# Patient Record
Sex: Female | Born: 1975
Health system: Southern US, Community
[De-identification: ages and names within clinical notes are randomized; demographics above are authoritative.]

## PROBLEM LIST (undated history)

## (undated) DIAGNOSIS — R87629 Unspecified abnormal cytological findings in specimens from vagina: Secondary | ICD-10-CM

## (undated) DIAGNOSIS — R87619 Unspecified abnormal cytological findings in specimens from cervix uteri: Secondary | ICD-10-CM

## (undated) DIAGNOSIS — Z8619 Personal history of other infectious and parasitic diseases: Secondary | ICD-10-CM

## (undated) DIAGNOSIS — IMO0002 Reserved for concepts with insufficient information to code with codable children: Secondary | ICD-10-CM

## (undated) DIAGNOSIS — A749 Chlamydial infection, unspecified: Secondary | ICD-10-CM

## (undated) HISTORY — DX: Unspecified abnormal cytological findings in specimens from cervix uteri: R87.619

## (undated) HISTORY — PX: WISDOM TOOTH EXTRACTION: SHX21

## (undated) HISTORY — DX: Reserved for concepts with insufficient information to code with codable children: IMO0002

## (undated) HISTORY — DX: Personal history of other infectious and parasitic diseases: Z86.19

## (undated) HISTORY — PX: OTHER SURGICAL HISTORY: SHX169

## (undated) HISTORY — DX: Chlamydial infection, unspecified: A74.9

## (undated) HISTORY — DX: Unspecified abnormal cytological findings in specimens from vagina: R87.629

## (undated) HISTORY — PX: COLONOSCOPY: SHX174

---

## 2011-01-11 ENCOUNTER — Other Ambulatory Visit (HOSPITAL_COMMUNITY): Payer: Self-pay | Admitting: Family Medicine

## 2011-01-11 DIAGNOSIS — N63 Unspecified lump in unspecified breast: Secondary | ICD-10-CM

## 2011-01-11 DIAGNOSIS — Z139 Encounter for screening, unspecified: Secondary | ICD-10-CM

## 2011-01-15 ENCOUNTER — Encounter (HOSPITAL_COMMUNITY): Payer: Self-pay

## 2011-01-30 ENCOUNTER — Ambulatory Visit (HOSPITAL_COMMUNITY)
Admission: RE | Admit: 2011-01-30 | Discharge: 2011-01-30 | Disposition: A | Discharge status: Home / Self Care | Payer: Self-pay | Source: Ambulatory Visit | Attending: Family Medicine | Admitting: Family Medicine

## 2011-01-30 DIAGNOSIS — N62 Hypertrophy of breast: Secondary | ICD-10-CM | POA: Insufficient documentation

## 2011-01-30 DIAGNOSIS — N63 Unspecified lump in unspecified breast: Secondary | ICD-10-CM

## 2011-02-06 ENCOUNTER — Telehealth (INDEPENDENT_AMBULATORY_CARE_PROVIDER_SITE_OTHER): Payer: Self-pay | Admitting: Internal Medicine

## 2011-02-06 NOTE — Telephone Encounter (Signed)
This is the wrong patient

## 2011-11-07 ENCOUNTER — Encounter (HOSPITAL_COMMUNITY): Payer: Self-pay | Admitting: *Deleted

## 2011-11-07 ENCOUNTER — Emergency Department (HOSPITAL_COMMUNITY)
Admission: EM | Admit: 2011-11-07 | Discharge: 2011-11-07 | Disposition: A | Discharge status: Home / Self Care | Payer: Self-pay | Attending: Emergency Medicine | Admitting: Emergency Medicine

## 2011-11-07 DIAGNOSIS — K0889 Other specified disorders of teeth and supporting structures: Secondary | ICD-10-CM

## 2011-11-07 DIAGNOSIS — R6884 Jaw pain: Secondary | ICD-10-CM | POA: Insufficient documentation

## 2011-11-07 MED ORDER — AMOXICILLIN 500 MG PO CAPS: ORAL_CAPSULE | ORAL | Status: DC

## 2011-11-07 MED ORDER — HYDROCODONE-ACETAMINOPHEN 5-325 MG PO TABS: 1.0000 | ORAL_TABLET | ORAL | Status: AC | PRN

## 2011-11-07 NOTE — Discharge Instructions (Signed)
Please use ibuprofen 600 mg (3 tablets) after each meal. Amoxicillin 2 times daily with food. Norco every 4 hours if needed for pain. Please see a dentist as soon as possible.Dental Pain A tooth ache may be caused by cavities (tooth decay). Cavities expose the nerve of the tooth to air and hot or cold temperatures. It may come from an infection or abscess (also called a boil or furuncle) around your tooth. It is also often caused by dental caries (tooth decay). This causes the pain you are having. DIAGNOSIS  Your caregiver can diagnose this problem by exam. TREATMENT   If caused by an infection, it may be treated with medications which kill germs (antibiotics) and pain medications as prescribed by your caregiver. Take medications as directed.   Only take over-the-counter or prescription medicines for pain, discomfort, or fever as directed by your caregiver.   Whether the tooth ache today is caused by infection or dental disease, you should see your dentist as soon as possible for further care.  SEEK MEDICAL CARE IF: The exam and treatment you received today has been provided on an emergency basis only. This is not a substitute for complete medical or dental care. If your problem worsens or new problems (symptoms) appear, and you are unable to meet with your dentist, call or return to this location. SEEK IMMEDIATE MEDICAL CARE IF:   You have a fever.   You develop redness and swelling of your face, jaw, or neck.   You are unable to open your mouth.   You have severe pain uncontrolled by pain medicine.  MAKE SURE YOU:   Understand these instructions.   Will watch your condition.   Will get help right away if you are not doing well or get worse.  Document Released: 05/27/2005 Document Revised: 05/16/2011 Document Reviewed: 01/13/2008 [facility] Patient Information 2012 [place], [place].

## 2011-11-07 NOTE — ED Provider Notes (Signed)
History     CSN: [id number]  Arrival date & time 11/07/11  1522   First MD Initiated Contact with Patient 11/07/11 1530      Chief Complaint  Patient presents with  . Jaw Pain    (Consider location/radiation/quality/duration/timing/severity/associated sxs/prior treatment) Patient is a 36 y.o. female presenting with tooth pain. The history is provided by the patient.  Dental PainThe primary symptoms include mouth pain. Primary symptoms do not include fever, shortness of breath or cough. The symptoms began more than 1 week ago. The symptoms are worsening. The symptoms occur frequently.  Additional symptoms include: dental sensitivity to temperature, gum swelling and jaw pain. Additional symptoms do not include: trouble swallowing and nosebleeds. Medical issues include: periodontal disease. Medical issues do not include: smoking and immunosuppression.    History reviewed. No pertinent past medical history.  Past Surgical History  Procedure Date  . Wisdom tooth extraction     History reviewed. No pertinent family history.  History  Substance Use Topics  . Smoking status: Never Smoker   . Smokeless tobacco: Not on file  . Alcohol Use: Yes    OB History    Grav Para Term Preterm Abortions TAB SAB Ect Mult Living                  Review of Systems  Constitutional: Negative for fever and activity change.       All ROS Neg except as noted in HPI  HENT: Negative for nosebleeds, trouble swallowing and neck pain.   Eyes: Negative for photophobia and discharge.  Respiratory: Negative for cough, shortness of breath and wheezing.   Cardiovascular: Negative for chest pain and palpitations.  Gastrointestinal: Negative for abdominal pain and blood in stool.  Genitourinary: Negative for dysuria, frequency and hematuria.  Musculoskeletal: Negative for back pain and arthralgias.  Skin: Negative.   Neurological: Negative for dizziness, seizures and speech difficulty.    Psychiatric/Behavioral: Negative for hallucinations and confusion.    Allergies  Review of patient's allergies indicates no known allergies.  Home Medications  No current outpatient prescriptions on file.  BP 138/85  Pulse 93  Temp(Src) 98.7 F (37.1 C) (Oral)  Resp 20  Ht 5\' 6"  (1.676 m)  Wt 205 lb (92.987 kg)  BMI 33.09 kg/m2  SpO2 100%  Physical Exam  Nursing note and vitals reviewed. Constitutional: She is oriented to person, place, and time. She appears well-developed and well-nourished.  Non-toxic appearance.  HENT:  Head: Normocephalic.  Right Ear: Tympanic membrane and external ear normal.  Left Ear: Tympanic membrane and external ear normal.       There is swelling of the gum around the first and second molar of the upper jaw. There is no visible abscess present. The airway is patent.  Eyes: EOM and lids are normal. Pupils are equal, round, and reactive to light.  Neck: Normal range of motion. Neck supple. Carotid bruit is not present.  Cardiovascular: Normal rate, regular rhythm, normal heart sounds, intact distal pulses and normal pulses.   Pulmonary/Chest: Breath sounds normal. No respiratory distress.  Abdominal: Soft. Bowel sounds are normal. There is no tenderness. There is no guarding.  Musculoskeletal: Normal range of motion.  Lymphadenopathy:       Head (right side): No submandibular adenopathy present.       Head (left side): No submandibular adenopathy present.    She has no cervical adenopathy.  Neurological: She is alert and oriented to person, place, and time. She has normal strength.  No cranial nerve deficit or sensory deficit.  Skin: Skin is warm and dry.  Psychiatric: She has a normal mood and affect. Her speech is normal.    ED Course  Procedures (including critical care time)  Labs Reviewed - No data to display No results found.   No diagnosis found.    MDM  I have reviewed nursing notes, vital signs, and all appropriate lab and  imaging results for this patient. Patient has swelling of the gum of the first and second molar of the upper jaw. Patient states she does not have insurance at this time to see a dentist. Dental resources were given to the patient. Prescription for Amoxil 500 mg and Norco 5 mg given to the patient patient also advised to use ibuprofen 3 times daily with a meal.       Kathie Dike, PA 11/07/11 1601

## 2011-11-07 NOTE — ED Notes (Signed)
Pt states that she had a filling fall out awhile back and chipped tooth on left lower jaw awhile ago, but pain is on right lower jaw now, states that she has not been able to see dentist due to no insurance, possible cavity to right lower molar

## 2011-11-07 NOTE — ED Provider Notes (Signed)
Medical screening examination/treatment/procedure(s) were performed by non-physician practitioner and as supervising physician I was immediately available for consultation/collaboration.   Rushton Early, MD 11/07/11 2035 

## 2011-11-07 NOTE — ED Notes (Signed)
Pt c/o pain and swelling to her right jaw x 2 weeks. Pt c/o toothache on lower and upper teeth.

## 2012-10-13 ENCOUNTER — Encounter: Payer: Self-pay | Admitting: *Deleted

## 2012-10-14 ENCOUNTER — Encounter: Payer: Self-pay | Admitting: Obstetrics & Gynecology

## 2012-10-14 ENCOUNTER — Ambulatory Visit (INDEPENDENT_AMBULATORY_CARE_PROVIDER_SITE_OTHER): Payer: Medicaid Other | Admitting: Obstetrics & Gynecology

## 2012-10-14 VITALS — BP 110/80 | Wt 209.0 lb

## 2012-10-14 DIAGNOSIS — Z3009 Encounter for other general counseling and advice on contraception: Secondary | ICD-10-CM

## 2012-10-14 DIAGNOSIS — Z302 Encounter for sterilization: Secondary | ICD-10-CM

## 2012-10-15 NOTE — Progress Notes (Signed)
Patient ID: Tammy Scott, female   DOB: 1975-11-23, 37 y.o.   MRN: [id number] Patient in for consult for BTL Tammy Scott is 37 years old, been on Depo for most of those years. Wants permanent sterilization  Discussed options at length  Opts for BTL Papers signed  Face to face time 20 minutes See H&P

## 2012-11-12 ENCOUNTER — Inpatient Hospital Stay (HOSPITAL_COMMUNITY): Admission: RE | Admit: 2012-11-12 | Payer: Self-pay | Source: Ambulatory Visit

## 2012-11-18 ENCOUNTER — Ambulatory Visit (HOSPITAL_COMMUNITY)
Admission: RE | Admit: 2012-11-18 | Payer: Medicaid Other | Source: Ambulatory Visit | Admitting: Obstetrics & Gynecology

## 2012-11-18 ENCOUNTER — Encounter (HOSPITAL_COMMUNITY): Admission: RE | Payer: Self-pay | Source: Ambulatory Visit

## 2012-11-18 SURGERY — LIGATION, FALLOPIAN TUBE, LAPAROSCOPIC
Anesthesia: General | Laterality: Bilateral

## 2012-12-03 ENCOUNTER — Encounter: Payer: Medicaid Other | Admitting: Obstetrics & Gynecology

## 2013-04-22 ENCOUNTER — Encounter (INDEPENDENT_AMBULATORY_CARE_PROVIDER_SITE_OTHER): Payer: Self-pay

## 2013-04-22 ENCOUNTER — Ambulatory Visit (INDEPENDENT_AMBULATORY_CARE_PROVIDER_SITE_OTHER): Payer: Medicaid Other | Admitting: Gastroenterology

## 2013-04-22 ENCOUNTER — Other Ambulatory Visit: Payer: Self-pay | Admitting: Gastroenterology

## 2013-04-22 ENCOUNTER — Encounter: Payer: Self-pay | Admitting: Gastroenterology

## 2013-04-22 VITALS — BP 128/77 | HR 85 | Temp 98.5°F | Wt 213.2 lb

## 2013-04-22 DIAGNOSIS — R399 Unspecified symptoms and signs involving the genitourinary system: Secondary | ICD-10-CM | POA: Insufficient documentation

## 2013-04-22 DIAGNOSIS — K921 Melena: Secondary | ICD-10-CM

## 2013-04-22 DIAGNOSIS — R3989 Other symptoms and signs involving the genitourinary system: Secondary | ICD-10-CM

## 2013-04-22 MED ORDER — PEG 3350-KCL-NA BICARB-NACL 420 G PO SOLR: 4000.0000 mL | ORAL | Status: DC

## 2013-04-22 MED ORDER — HYDROCORTISONE 2.5 % RE CREA: 1.0000 "application " | TOPICAL_CREAM | Freq: Two times a day (BID) | RECTAL | Status: DC

## 2013-04-22 NOTE — Patient Instructions (Signed)
We have scheduled you for a colonoscopy with Dr. Darrick Penna in the near future.  For rectal itching: use the Anusol cream twice a day for 7 days.   Add a probiotic daily. This can be found over the counter. Some examples are Digestive Advantage, Phillip's Colon Health, walgreen's brand, Align, Restora.  Please complete the urinalysis. We will be in touch with the results! Sometimes it takes a few days to hear the final word.

## 2013-04-22 NOTE — Assessment & Plan Note (Signed)
Low-volume hematochezia without other concerning symptoms. Question benign source such as internal hemorrhoids. Start Anusol cream and proceed with colonoscopy with Dr. Darrick Penna in the near future. The risks, benefits, and alternatives have been discussed in detail with the patient. They state understanding and desire to proceed. May be a good candidate for banding as outpatient. Further recommendations to follow.   Add probiotic as well; samples provided.

## 2013-04-22 NOTE — Assessment & Plan Note (Signed)
Check UA with culture reflex. Refer back to PCP if worsening of symptoms.

## 2013-04-22 NOTE — Progress Notes (Signed)
Primary Care Physician:  Tammy Scott, [name] Primary Gastroenterologist:  Dr. Darrick Scott   Chief Complaint  Patient presents with  . Rectal Bleeding    HPI:   Tammy Scott presents today at the request of Tammy Scott, [name], due to low-volume hematochezia. Notes paper hematochezia for about 3-4 weeks. Not a daily occurrence. Occasional rectal itching, no pain. Feels like bowel habits are regular for her. BMs are long, tubular, well-formed. Every once in awhile may have an upset stomach but otherwise ok. Denies significant abdominal pain. Occasional discomfort after bowel movement. Unable to correlate with anything. Denies unexplained weight loss or lack of appetite. Urinating frequently. Sometimes foul odor. Last Thursday, smelled like ammonia. Feels gassy now. Feels full of gas lately.   Past Medical History  Diagnosis Date  . Abnormal pap   . Chlamydia   . History of gonorrhea     Past Surgical History  Procedure Laterality Date  . Wisdom tooth extraction      Current Outpatient Prescriptions  Medication Sig Dispense Refill  . cetirizine (ZYRTEC) 10 MG tablet Take 10 mg by mouth daily.      . cholecalciferol (VITAMIN D) 1000 UNITS tablet Take 1,000 Units by mouth daily.      . medroxyPROGESTERone (DEPO-PROVERA) 150 MG/ML injection Inject 150 mg into the muscle every 3 (three) months.      . Multiple Vitamin (MULTIVITAMIN) tablet Take 1 tablet by mouth daily.       No current facility-administered medications for this visit.    Allergies as of 04/22/2013  . (No Known Allergies)    Family History  Problem Relation Age of Onset  . Cancer Mother     breast  . Colon polyps Mother     History   Social History  . Marital Status: Single    Spouse Name: N/A    Number of Children: N/A  . Years of Education: N/A   Occupational History  . full-time Consulting civil engineer   . [employer]    Social History Main Topics  . Smoking status: Former Games developer  . Smokeless tobacco: Not  on file  . Alcohol Use: Yes     Comment: socially  . Drug Use: No  . Sexual Activity: Yes    Birth Control/ Protection: Injection   Other Topics Concern  . Not on file   Social History Narrative  . No narrative on file    Review of Systems: As mentioned in HPI.   Physical Exam: BP 128/77  Pulse 85  Temp(Src) 98.5 F (36.9 C) (Oral)  Wt 213 lb 3.2 oz (96.707 kg) General:   Alert and oriented. Pleasant and cooperative. Well-nourished and well-developed.  Head:  Normocephalic and atraumatic. Eyes:  Without icterus, sclera clear and conjunctiva pink.  Ears:  Normal auditory acuity. Nose:  No deformity, discharge,  or lesions. Mouth:  No deformity or lesions, oral mucosa pink.  Neck:  Supple, without mass or thyromegaly. Lungs:  Clear to auscultation bilaterally. No wheezes, rales, or rhonchi. No distress.  Heart:  S1, S2 present without murmurs appreciated.  Abdomen:  +BS, soft, non-tender and non-distended. No HSM noted. No guarding or rebound. No masses appreciated.  Rectal:  Deferred  Msk:  Symmetrical without gross deformities. Normal posture. Pulses:  Normal pulses noted. Extremities:  Without clubbing or edema. Neurologic:  Alert and  oriented x4;  grossly normal neurologically. Skin:  Intact without significant lesions or rashes. Cervical Nodes:  No significant cervical adenopathy. Psych:  Alert and cooperative.  Normal mood and affect.  Hgb 13.1 per outside records. Heme negative.

## 2013-04-23 NOTE — Progress Notes (Signed)
Cc PCP 

## 2013-04-28 LAB — URINALYSIS W MICROSCOPIC + REFLEX CULTURE
Bilirubin Urine: NEGATIVE
Glucose, UA: NEGATIVE mg/dL
Specific Gravity, Urine: 1.028 (ref 1.005–1.030)
Urobilinogen, UA: 0.2 mg/dL (ref 0.0–1.0)

## 2013-04-30 ENCOUNTER — Encounter (HOSPITAL_COMMUNITY): Payer: Self-pay | Admitting: *Deleted

## 2013-04-30 ENCOUNTER — Encounter (HOSPITAL_COMMUNITY)
Admission: RE | Disposition: A | Discharge status: Home / Self Care | Payer: Self-pay | Source: Ambulatory Visit | Attending: Gastroenterology

## 2013-04-30 ENCOUNTER — Ambulatory Visit (HOSPITAL_COMMUNITY)
Admission: RE | Admit: 2013-04-30 | Discharge: 2013-04-30 | Disposition: A | Discharge status: Home / Self Care | Payer: BC Managed Care – PPO | Source: Ambulatory Visit | Attending: Gastroenterology | Admitting: Gastroenterology

## 2013-04-30 DIAGNOSIS — K625 Hemorrhage of anus and rectum: Secondary | ICD-10-CM | POA: Insufficient documentation

## 2013-04-30 DIAGNOSIS — K648 Other hemorrhoids: Secondary | ICD-10-CM | POA: Insufficient documentation

## 2013-04-30 DIAGNOSIS — K921 Melena: Secondary | ICD-10-CM

## 2013-04-30 HISTORY — PX: COLONOSCOPY: SHX5424

## 2013-04-30 SURGERY — COLONOSCOPY
Anesthesia: Moderate Sedation

## 2013-04-30 MED ORDER — MEPERIDINE HCL 100 MG/ML IJ SOLN
INTRAMUSCULAR | Status: AC
  Filled 2013-04-30: qty 2

## 2013-04-30 MED ORDER — MIDAZOLAM HCL 5 MG/5ML IJ SOLN
INTRAMUSCULAR | Status: DC | PRN
  Administered 2013-04-30 (×3): 2 mg via INTRAVENOUS

## 2013-04-30 MED ORDER — MEPERIDINE HCL 100 MG/ML IJ SOLN
INTRAMUSCULAR | Status: DC | PRN
  Administered 2013-04-30: 50 mg via INTRAVENOUS
  Administered 2013-04-30 (×2): 25 mg via INTRAVENOUS

## 2013-04-30 MED ORDER — SODIUM CHLORIDE 0.9 % IV SOLN
INTRAVENOUS | Status: DC
  Administered 2013-04-30: 13:00:00 via INTRAVENOUS

## 2013-04-30 MED ORDER — MIDAZOLAM HCL 5 MG/5ML IJ SOLN
INTRAMUSCULAR | Status: AC
  Filled 2013-04-30: qty 10

## 2013-04-30 MED ORDER — STERILE WATER FOR IRRIGATION IR SOLN
Status: DC | PRN
  Administered 2013-04-30: 13:00:00

## 2013-04-30 NOTE — Op Note (Signed)
[facility] [address] [place], [address]   COLONOSCOPY PROCEDURE REPORT  PATIENT: Tammy Scott, Tammy Scott  MR#: [id number] BIRTHDATE: 02/24/1976 , 37  yrs. old GENDER: Female ENDOSCOPIST: Jonette Eva, MD REFERRED [id number] Muse, PA PROCEDURE DATE:  04/30/2013 PROCEDURE:   Colonoscopy, diagnostic INDICATIONS:Rectal Bleeding. MEDICATIONS: Demerol 100 mg IV and Versed 6 mg IV  DESCRIPTION OF PROCEDURE:    Physical exam was performed.  Informed consent was obtained from the patient after explaining the benefits, risks, and alternatives to procedure.  The patient was connected to monitor and placed in left lateral position. Continuous oxygen was provided by nasal cannula and IV medicine administered through an indwelling cannula.  After administration of sedation and rectal exam, the patients rectum was intubated and the EC-[id number] ([id number])  colonoscope was advanced under direct visualization to the ileum.  The scope was removed slowly by carefully examining the color, texture, anatomy, and integrity mucosa on the way out.  The patient was recovered in endoscopy and discharged home in satisfactory condition.    COLON FINDINGS: The mucosa appeared normal in the terminal ileum.  , A normal appearing cecum, ileocecal valve, and appendiceal orifice were identified.  The ascending, hepatic flexure, transverse, splenic flexure, descending, sigmoid colon and rectum appeared unremarkable.  No polyps or cancers were seen.  , and Small internal hemorrhoids were found.  PREP QUALITY: good.  CECAL W/D TIME: 17 minutes     COMPLICATIONS: None  ENDOSCOPIC IMPRESSION: 1.   Normal mucosa in the terminal ileum 2.   Normal colon 3.   Small internal hemorrhoids  RECOMMENDATIONS: HIGH FIBER DIET DRINK WATER COMPLETER ANUSOL TCS IN 10 YEARS       _______________________________ eSignedJonette Eva, MD 04/30/2013 2:47 PM

## 2013-04-30 NOTE — H&P (Signed)
  Primary Care Physician:  Tylene Fantasia., PA-C Primary Gastroenterologist:  Dr. Darrick Penna  Pre-Procedure History & Physical: HPI:  Tammy Scott is a 37 y.o. female here for  BRBPR.  Past Medical History  Diagnosis Date  . Abnormal pap   . Chlamydia   . History of gonorrhea     Past Surgical History  Procedure Laterality Date  . Wisdom tooth extraction    . Colyposcopy      Prior to Admission medications   Medication Sig Start Date End Date Taking? Authorizing Provider  hydrocortisone (ANUSOL-HC) 2.5 % rectal cream Place 1 application rectally 2 (two) times daily. 04/22/13  Yes Nira Retort, NP  polyethylene glycol-electrolytes (TRILYTE) 420 G solution Take 4,000 mLs by mouth as directed. 04/22/13  Yes West Bali, MD  cetirizine (ZYRTEC) 10 MG tablet Take 10 mg by mouth daily.    Historical Provider, MD  cholecalciferol (VITAMIN D) 1000 UNITS tablet Take 1,000 Units by mouth daily.    Historical Provider, MD  medroxyPROGESTERone (DEPO-PROVERA) 150 MG/ML injection Inject 150 mg into the muscle every 3 (three) months.    Historical Provider, MD  Multiple Vitamin (MULTIVITAMIN) tablet Take 1 tablet by mouth daily.    Historical Provider, MD    Allergies as of 04/22/2013  . (No Known Allergies)    Family History  Problem Relation Age of Onset  . Cancer Mother     breast  . Colon polyps Mother     History   Social History  . Marital Status: Single    Spouse Name: N/A    Number of Children: N/A  . Years of Education: N/A   Occupational History  . full-time Consulting civil engineer   . [employer]    Social History Main Topics  . Smoking status: Former Games developer  . Smokeless tobacco: Not on file  . Alcohol Use: Yes     Comment: socially  . Drug Use: No  . Sexual Activity: Yes    Birth Control/ Protection: Injection   Other Topics Concern  . Not on file   Social History Narrative  . No narrative on file    Review of Systems: See HPI, otherwise negative  ROS   Physical Exam: BP 128/73  Pulse 69  Temp(Src) 98.3 F (36.8 C) (Oral)  Resp 18  Ht 5\' 6"  (1.676 m)  Wt 213 lb (96.616 kg)  BMI 34.40 kg/m2  SpO2 100% General:   Alert,  pleasant and cooperative in NAD Head:  Normocephalic and atraumatic. Neck:  Supple; Lungs:  Clear throughout to auscultation.    Heart:  Regular rate and rhythm. Abdomen:  Soft, nontender and nondistended. Normal bowel sounds, without guarding, and without rebound.   Neurologic:  Alert and  oriented x4;  grossly normal neurologically.  Impression/Plan:    BRBPR  PLAN: TCS TODAY

## 2013-04-30 NOTE — Progress Notes (Signed)
REVIEWED.  

## 2013-05-03 NOTE — Progress Notes (Signed)
Quick Note:  LMOM for a return call. Routing to Soledad Gerlach to fax copy to [facility]. ______

## 2013-05-05 ENCOUNTER — Encounter (HOSPITAL_COMMUNITY): Payer: Self-pay | Admitting: Gastroenterology

## 2013-05-05 NOTE — Progress Notes (Signed)
Quick Note:  Called pt and informed. She said she is not having any urinary symptoms. ______

## 2013-07-06 ENCOUNTER — Emergency Department (HOSPITAL_COMMUNITY)
Admission: EM | Admit: 2013-07-06 | Discharge: 2013-07-06 | Disposition: A | Discharge status: Home / Self Care | Payer: BC Managed Care – PPO | Attending: Emergency Medicine | Admitting: Emergency Medicine

## 2013-07-06 ENCOUNTER — Encounter (HOSPITAL_COMMUNITY): Payer: Self-pay | Admitting: Emergency Medicine

## 2013-07-06 ENCOUNTER — Emergency Department (HOSPITAL_COMMUNITY): Payer: BC Managed Care – PPO

## 2013-07-06 DIAGNOSIS — Y939 Activity, unspecified: Secondary | ICD-10-CM | POA: Insufficient documentation

## 2013-07-06 DIAGNOSIS — S90121A Contusion of right lesser toe(s) without damage to nail, initial encounter: Secondary | ICD-10-CM

## 2013-07-06 DIAGNOSIS — Z8619 Personal history of other infectious and parasitic diseases: Secondary | ICD-10-CM | POA: Insufficient documentation

## 2013-07-06 DIAGNOSIS — IMO0002 Reserved for concepts with insufficient information to code with codable children: Secondary | ICD-10-CM | POA: Insufficient documentation

## 2013-07-06 DIAGNOSIS — Z87891 Personal history of nicotine dependence: Secondary | ICD-10-CM | POA: Insufficient documentation

## 2013-07-06 DIAGNOSIS — Z791 Long term (current) use of non-steroidal anti-inflammatories (NSAID): Secondary | ICD-10-CM | POA: Insufficient documentation

## 2013-07-06 DIAGNOSIS — S90129A Contusion of unspecified lesser toe(s) without damage to nail, initial encounter: Secondary | ICD-10-CM | POA: Insufficient documentation

## 2013-07-06 DIAGNOSIS — Z79899 Other long term (current) drug therapy: Secondary | ICD-10-CM | POA: Insufficient documentation

## 2013-07-06 DIAGNOSIS — Z23 Encounter for immunization: Secondary | ICD-10-CM | POA: Insufficient documentation

## 2013-07-06 DIAGNOSIS — Y929 Unspecified place or not applicable: Secondary | ICD-10-CM | POA: Insufficient documentation

## 2013-07-06 MED ORDER — NAPROXEN 250 MG PO TABS: 250.0000 mg | ORAL_TABLET | Freq: Two times a day (BID) | ORAL | Status: DC

## 2013-07-06 NOTE — ED Provider Notes (Signed)
CSN: [id number]     Arrival date & time 07/06/13  4098 History   First MD Initiated Contact with Patient 07/06/13 [phone]     Chief Complaint  Patient presents with  . Toe Pain    HPI Pt was seen at 0850. Per pt, c/o gradual onset and persistence of constant right 5th toe "pain" that began last night. Pt states the pain began after she hit her toe against a table. Denies open wounds, no foot/ankle pain, no other injury, no focal motor weakness, no tingling/numbness in extremity.    Past Medical History  Diagnosis Date  . Abnormal pap   . Chlamydia   . History of gonorrhea    Past Surgical History  Procedure Laterality Date  . Wisdom tooth extraction    . Colyposcopy    . Colonoscopy N/A 04/30/2013    Procedure: COLONOSCOPY;  Surgeon: West Bali, MD;  Location: [facility];  Service: Endoscopy;  Laterality: N/A;  1:15   Family History  Problem Relation Age of Onset  . Cancer Mother     breast  . Colon polyps Mother    History  Substance Use Topics  . Smoking status: Former Games developer  . Smokeless tobacco: Not on file  . Alcohol Use: Yes     Comment: socially   OB History   Grav Para Term Preterm Abortions TAB SAB Ect Mult Living   3 2   1  1         Review of Systems ROS: Statement: All systems negative except as marked or noted in the HPI; Constitutional: Negative for fever and chills. ; ; Eyes: Negative for eye pain, redness and discharge. ; ; ENMT: Negative for ear pain, hoarseness, nasal congestion, sinus pressure and sore throat. ; ; Cardiovascular: Negative for chest pain, palpitations, diaphoresis, dyspnea and peripheral edema. ; ; Respiratory: Negative for cough, wheezing and stridor. ; ; Gastrointestinal: Negative for nausea, vomiting, diarrhea, abdominal pain, blood in stool, hematemesis, jaundice and rectal bleeding. . ; ; Genitourinary: Negative for dysuria, flank pain and hematuria. ; ; Musculoskeletal: +right 5th toe pain. Negative for back pain and neck pain.  Negative for swelling and deformity.; ; Skin: Negative for pruritus, rash, abrasions, blisters, bruising and skin lesion.; ; Neuro: Negative for headache, lightheadedness and neck stiffness. Negative for weakness, altered level of consciousness , altered mental status, extremity weakness, paresthesias, involuntary movement, seizure and syncope.       Allergies  Review of patient's allergies indicates no known allergies.  Home Medications   Current Outpatient Rx  Name  Route  Sig  Dispense  Refill  . cetirizine (ZYRTEC) 10 MG tablet   Oral   Take 10 mg by mouth daily.         . cholecalciferol (VITAMIN D) 1000 UNITS tablet   Oral   Take 1,000 Units by mouth daily.         . hydrocortisone (ANUSOL-HC) 2.5 % rectal cream   Rectal   Place 1 application rectally 2 (two) times daily.   30 g   1   . medroxyPROGESTERone (DEPO-PROVERA) 150 MG/ML injection   Intramuscular   Inject 150 mg into the muscle every 3 (three) months.         . Multiple Vitamin (MULTIVITAMIN) tablet   Oral   Take 1 tablet by mouth daily.         . naproxen (NAPROSYN) 250 MG tablet   Oral   Take 1 tablet (250 mg total) by  mouth 2 (two) times daily with a meal.   14 tablet   0    BP 150/80  Pulse 74  Temp(Src) 98.1 F (36.7 C) (Oral)  Resp 16  Ht 5\' 6"  (1.676 m)  Wt 210 lb (95.255 kg)  BMI 33.91 kg/m2  SpO2 100% Physical Exam 0855: Physical examination:  Nursing notes reviewed; Vital signs and O2 SAT reviewed;  Constitutional: Well developed, Well nourished, Well hydrated, In no acute distress; Head:  Normocephalic, atraumatic; Eyes: EOMI, PERRL, No scleral icterus; ENMT: Mouth and pharynx normal, Mucous membranes moist; Neck: Supple, Full range of motion; Cardiovascular: Regular rate and rhythm; Respiratory: Breath sounds clear, No wheezes.  Speaking full sentences with ease, Normal respiratory effort/excursion; Chest: No deformity, Movement normal; Abdomen: Nondistended; Extremities: Pulses  normal, +mild right 5th toe generalized tenderness to palp without deformity, edema, erythema, ecchymosis, or open wounds. No right foot/ankle/knee tenderness. No calf edema or asymmetry.; Neuro: AA&Ox3, Major CN grossly intact.  Speech clear. No gross focal motor or sensory deficits in extremities. Climbs on and off stretcher easily by herself. Gait steady.; Skin: Color normal, Warm, Dry.   ED Course  Procedures    EKG Interpretation   None       MDM  MDM Reviewed: previous chart, nursing note and vitals Interpretation: x-ray     Dg Foot Complete Right 07/06/2013   CLINICAL DATA:  Blow to the right fifth metatarsal.  Pain.  EXAM: RIGHT FOOT COMPLETE - 3+ VIEW  COMPARISON:  None.  FINDINGS: There is no evidence of fracture or dislocation. There is no evidence of arthropathy or other focal bone abnormality. Soft tissues are unremarkable.  IMPRESSION: Negative exam.   Electronically Signed   By: Drusilla Kannerhomas  Dalessio M.D.   On: 07/06/2013 09:18    0925:  No acute findings on XR; tx symptomatically at this time. Dx and testing d/w pt.  Questions answered.  Verb understanding, agreeable to d/c home with outpt f/u.   Laray AngerKathleen M Loa Idler, DO 07/07/13 1744

## 2013-07-06 NOTE — ED Notes (Signed)
Pt reports right little toe pain starting last night. Pt. Reports hitting toe on table yesterday.

## 2013-07-06 NOTE — Discharge Instructions (Signed)
°Emergency Department Resource Guide °1) Find a Doctor and Pay Out of Pocket °Although you won't have to find out who is covered by your insurance plan, it is a good idea to ask around and get recommendations. You will then need to call the office and see if the doctor you have chosen will accept you as a new patient and what types of options they offer for patients who are self-pay. Some doctors offer discounts or will set up payment plans for their patients who do not have insurance, but you will need to ask so you aren't surprised when you get to your appointment. ° °2) Contact Your Local Health Department °Not all health departments have doctors that can see patients for sick visits, but many do, so it is worth a call to see if yours does. If you don't know where your local health department is, you can check in your phone book. The CDC also has a tool to help you locate your state's health department, and many state websites also have listings of all of their local health departments. ° °3) Find a Walk-in Clinic °If your illness is not likely to be very severe or complicated, you may want to try a walk in clinic. These are popping up all over the country in pharmacies, drugstores, and shopping centers. They're usually staffed by nurse practitioners or physician assistants that have been trained to treat common illnesses and complaints. They're usually fairly quick and inexpensive. However, if you have serious medical issues or chronic medical problems, these are probably not your best option. ° °No Primary Care Doctor: °- Call [facility] at  832-8000 - they can help you locate a primary care doctor that  accepts your insurance, provides certain services, etc. °- Physician Referral Service- 1-800-533-3463 ° °Chronic Pain Problems: °Organization         Address  Phone   Notes  °[facility]  (336) 297-2271 Patients need to be referred by their primary care doctor.  ° °Medication  Assistance: °Organization         Address  Phone   Notes  °[place] [facility] [address] °[place], [place] [address] (336) 641-8030 --Must be a resident of [place] °-- Must have NO insurance coverage whatsoever (no Medicaid/ Medicare, etc.) °-- The pt. MUST have a primary care doctor that directs their care regularly and follows them in the community °  °[facility]  (866) 331-1348   °[employer]  (888) 892-1162   ° °Agencies that provide inexpensive medical care: °Organization         Address  Phone   Notes  °[facility]  (336) 832-8035   °[facility]    (336) 832-7272   °[facility] [address] °[place], [place] [address] (336) 832-4777   °[facility] [address], °[place] (336) 271-4999   °[facility]    (336) 373-0678   °[facility]    (336) 272-1050   °[facility] ° [address], [place] Phone:  (336) 832-4444, Fax:  (336) 832-4440 Hours of Operation:  9 am - 6 pm, M-F.  Also accepts Medicaid/Medicare and self-pay.  °[facility] ° [address], [place] Phone: (336) 832-3150, Fax: (336) [phone]. Hours of Operation:  8:30 am - 5:30 pm, M-F.  Also accepts Medicaid and self-pay.  °[facility] [place] [address], [place] Phone: (336) 878-6027   °[facility] [address], [place], [place] (336)723-1848, Ext. 123 Mondays & Thursdays: 7-9 AM.  First 15 patients are seen on a first come, first serve basis. °  ° °Medicaid-accepting [place] Providers: ° °Organization         Address  Phone   Notes  °[facility] [address], [place] (336) 641-2100 Also accepts self-pay patients.  °[facility] [address], [place] ° (336) 856-9996   °[facility] [address], [place]  (336) 288-8857   °[facility] [address] [place] [address], [place] (336) 299-7000   °Veita Bland [address], [place]  ° (336) 373-1557 Only accepts [place] Access Medicaid patients after they have their name applied to their card.  ° °Self-Pay (no insurance) in [place]: ° °Organization         Address  Phone   Notes  °Sickle Cell Patients, [facility] [address], [place] (336) 832-1970   °[facility] [address], [place] (336) 832-4400   °[facility] ° [address], [place] (336) 992-4800   °[facility]/Dr. Osei-Bonsu ° [address] [place] [address], [place] or [address], [place] (336) 841-8500 Phone number for both [place] and [place] locations is the same.  °[facility] [address], [place] (336) 299-0000   °[facility] [address] [place] [address], [place] or [address] (336) 852-7530 °(336) 878-2260   °[facility] [address], [place] (336) 350-1642, phone; (336) 294-5005, fax Sees patients 1st and 3rd Saturday of every month.  Must not qualify for public or private insurance (i.e. Medicaid, Medicare, [place] Health Choice, Veterans' Benefits) • Household income should be no more than 200% of the poverty level •The clinic cannot treat you if you are pregnant or think you are pregnant • Sexually transmitted diseases are not treated at the clinic.  ° ° °Dental Care: °Organization         Address  Phone  Notes  °[place] [facility] [address], [place] (336) 641-6152 Accepts children up to age 21 who are enrolled in Medicaid or [place] Health Choice; pregnant women with a Medicaid card; and children who have applied for Medicaid or [place] Health Choice, but were declined, whose parents can pay a reduced fee at time of service.  °[place]  [facility] [place]  [address], [place] (336) 641-7733 Accepts children up to age 21 who are enrolled in Medicaid or [place] Health Choice; pregnant women with a Medicaid card; and children who have applied for Medicaid or [place] Health Choice, but were declined, whose parents can pay a reduced fee at time of service.  °[facility] ° [address], [place] (336) 641-4533 Patients are seen by appointment only. Walk-ins are not accepted. [facility] will see patients 18 years of age and older. °Monday - Tuesday (8am-5pm) °Most Wednesdays (8:30-5pm) °$30 per visit, cash only  °[facility] ° [address], [place] (336) 641-4533 Patients are seen by appointment only. Walk-ins are not accepted. [facility] will see patients 18 years of age and older. °One   Wednesday Evening (Monthly: Volunteer Based).  $30 per visit, cash only  °[facility]  (919) 537-3737 for adults; Children under age 4, call Graduate Pediatric Dentistry at (919) [phone]. Children aged 4-14, please call (919) [phone] to request a pediatric application. ° Dental services are provided in all areas of dental care including fillings, crowns and bridges, complete and partial dentures, implants, gum treatment, root canals, and extractions. Preventive care is also provided. Treatment is provided to both adults and children. °Patients are selected via a lottery and there is often a waiting list. °  °[facility] [address], °[place] ° (336) 763-8833 [email or web address] °  °[facility] [address], [place], [place] (336)723-1848, Ext. 123 Second and Fourth Thursday of each month, opens at 6:30 AM; Clinic ends at 9 AM.  Patients are seen on a first-come first-served basis, and a limited number are seen during each clinic.  ° °[facility] ° [address], [place], [place] (336) 723-7904    Eligibility Requirements °You must have lived in [place], [place], or [place] counties for at least the last three months. °  You cannot be eligible for state or federal sponsored healthcare insurance, including Veterans Administration, Medicaid, or Medicare. °  You generally cannot be eligible for healthcare insurance through your employer.  °  How to apply: °Eligibility screenings are held every Tuesday and Wednesday afternoon from 1:00 pm until 4:00 pm. You do not need an appointment for the interview!  °[facility] [address], [place], [place] 336-631-2330   °[facility]  336-342-8273   °[facility]  336-703-3100   °[facility]  336-570-6415   ° °Behavioral Health Resources in the Community: °Intensive Outpatient Programs °Organization         Address  Phone  Notes  °[facility] [address], [place], [place] 336-878-6098   °[facility] [address], [place], [place] 336-832-9800   °[facility] [address], [place], [place] ° 336-882-2125   °[facility] [address],  °[place], [place] 1-800-853-5163 or 336-641-4981   °Substance Abuse Resources °Organization         Address  Phone  Notes  °[facility]  336-882-2125   °[facility]  336-784-9470   °[facility]  336-285-9073   °[facility]  336-845-3988   °Residential & Outpatient Substance Abuse Program  1-800-659-3381   °Psychological Services °Organization         Address  Phone  Notes  °[facility]  336- 832-9600   °[facility]  336- 378-7881   °[facility] [address], [place] 1-800-853-5163 or 336-641-4981   ° °Mobile Crisis Teams °Organization         Address  Phone  Notes  °[facility], Mobile Crisis Care Unit  1-877-626-1772   °Assertive °[facility] ° [address]  [place], [place] 336-834-9664   °Sharon DeEsch [address] °[place] 336-554-5454   ° °Self-Help/Support Groups °Organization         Address  Phone             Notes  °[facility] of  - variety of support groups  336- 373-1402 Call for more information  °Narcotics Anonymous (NA), [facility] [address], °[place]  2 meetings at this location  ° °  Residential Treatment Programs Organization         Address  Phone  Notes  [facility] [address],    [place]  [phone]   [facility]  [address], [place] [phone], [place], [place] [phone]   [facility] [address], [place] [phone]) [phone] Admissions: 8am-3pm M-F  [facility] [address]    [place], [place] [phone]   [facility] [address], [place], [place] [phone]   [facility] [address]  [place], [place] [phone]) [phone]   [facility] Intensive Outpatient [address], [place], [place] [phone]   [facility] [address]  [place], [place] [phone] or [phone]   [facility] [address] [place], [place] [phone] Accepts Medicaid  [facility] [address]  [place] [phone] Substance Abuse/Addiction Treatment   [facility]         Address  Phone  Notes  [facility]  226-795-0269(888) [phone]   Angie FavaJulie Brannon, PhD [address] [place], [place]   [phone](336) [phone] or [phone](336) [phone]   [facility]   [address] [place], [place] [phone](336) [phone]   [facility] [address], [place], [place] ([phone])[phone](336) [phone] Insurance/Medicaid/sponsorship through [facility] [address]                                    [place], [place] [phone](336) [phone] Therapy/tele-psych/case    [facility] [address]   [place], [place] [phone](336) [phone]    Dr. Lolly MustacheArfeen  [phone](336) [phone]   [facility]  [employer] [facility] 1) [address], [place] 2) [address], [place] 3)  [address], [place] [phone](336) [phone](336) [phone]  [phone](336) [phone]   [facility] Child Abuse Hotline 3518000087(336) (825)495-3556 or 705-442-0125(336) (606) [phone] (After Hours)       Take the prescription as directed. Also can take over the counter tylenol, as directed on packaging, as needed for discomfort. Apply moist heat or ice to the area(s) of discomfort, for 15 minutes at a time, several times per day for the next few days.  Do not fall asleep on a heating or ice pack.  Call your regular medical doctor today to schedule a follow up appointment this week.  Return to the Emergency Department immediately if worsening.

## 2014-04-11 ENCOUNTER — Encounter (HOSPITAL_COMMUNITY): Payer: Self-pay | Admitting: Emergency Medicine

## 2014-04-21 ENCOUNTER — Telehealth: Payer: Self-pay | Admitting: Family Medicine

## 2014-04-27 NOTE — Telephone Encounter (Signed)
error 

## 2014-06-12 ENCOUNTER — Emergency Department (HOSPITAL_COMMUNITY)
Admission: EM | Admit: 2014-06-12 | Discharge: 2014-06-12 | Disposition: A | Discharge status: Home / Self Care | Payer: BC Managed Care – PPO | Attending: Emergency Medicine | Admitting: Emergency Medicine

## 2014-06-12 ENCOUNTER — Encounter (HOSPITAL_COMMUNITY): Payer: Self-pay | Admitting: Emergency Medicine

## 2014-06-12 DIAGNOSIS — Z79899 Other long term (current) drug therapy: Secondary | ICD-10-CM | POA: Diagnosis not present

## 2014-06-12 DIAGNOSIS — Z87891 Personal history of nicotine dependence: Secondary | ICD-10-CM | POA: Diagnosis not present

## 2014-06-12 DIAGNOSIS — Z7952 Long term (current) use of systemic steroids: Secondary | ICD-10-CM | POA: Diagnosis not present

## 2014-06-12 DIAGNOSIS — H6502 Acute serous otitis media, left ear: Secondary | ICD-10-CM

## 2014-06-12 DIAGNOSIS — Z8619 Personal history of other infectious and parasitic diseases: Secondary | ICD-10-CM | POA: Insufficient documentation

## 2014-06-12 DIAGNOSIS — Z791 Long term (current) use of non-steroidal anti-inflammatories (NSAID): Secondary | ICD-10-CM | POA: Diagnosis not present

## 2014-06-12 DIAGNOSIS — R05 Cough: Secondary | ICD-10-CM | POA: Diagnosis present

## 2014-06-12 DIAGNOSIS — J069 Acute upper respiratory infection, unspecified: Secondary | ICD-10-CM | POA: Insufficient documentation

## 2014-06-12 MED ORDER — MAGIC MOUTHWASH W/LIDOCAINE: 5.0000 mL | Freq: Three times a day (TID) | ORAL | Status: DC | PRN

## 2014-06-12 MED ORDER — IBUPROFEN 800 MG PO TABS
800.0000 mg | ORAL_TABLET | Freq: Once | ORAL | Status: AC
  Administered 2014-06-12: 800 mg via ORAL
  Filled 2014-06-12: qty 1

## 2014-06-12 MED ORDER — PREDNISONE 10 MG PO TABS: ORAL_TABLET | ORAL | Status: DC

## 2014-06-12 MED ORDER — GUAIFENESIN-CODEINE 100-10 MG/5ML PO SOLN
10.0000 mL | Freq: Once | ORAL | Status: AC
  Administered 2014-06-12: 10 mL via ORAL
  Filled 2014-06-12: qty 10

## 2014-06-12 NOTE — Discharge Instructions (Signed)
Upper Respiratory Infection, Adult An upper respiratory infection (URI) is also known as the common cold. It is often caused by a type of germ (virus). Colds are easily spread (contagious). You can pass it to others by kissing, coughing, sneezing, or drinking out of the same glass. Usually, you get better in 1 or 2 weeks.  HOME CARE   Only take medicine as told by your doctor.  Use a warm mist humidifier or breathe in steam from a hot shower.  Drink enough water and fluids to keep your pee (urine) clear or pale yellow.  Get plenty of rest.  Return to work when your temperature is back to normal or as told by your doctor. You may use a face mask and wash your hands to stop your cold from spreading. GET HELP RIGHT AWAY IF:   After the first few days, you feel you are getting worse.  You have questions about your medicine.  You have chills, shortness of breath, or brown or red spit (mucus).  You have yellow or brown snot (nasal discharge) or pain in the face, especially when you bend forward.  You have a fever, puffy (swollen) neck, pain when you swallow, or white spots in the back of your throat.  You have a bad headache, ear pain, sinus pain, or chest pain.  You have a high-pitched whistling sound when you breathe in and out (wheezing).  You have a lasting cough or cough up blood.  You have sore muscles or a stiff neck. MAKE SURE YOU:   Understand these instructions.  Will watch your condition.  Will get help right away if you are not doing well or get worse. Document Released: 11/13/2007 Document Revised: 08/19/2011 Document Reviewed: 09/01/2013 ExitCare Patient Information 2015 [employer]. This information is not intended to replace advice given to you by your health care provider. Make sure you discuss any questions you have with your health care provider.  

## 2014-06-12 NOTE — ED Provider Notes (Signed)
CSN: [id number]     Arrival date & time 06/12/14  1719 History  This chart was scribed for non-physician practitioner, Pauline Aus, PA-C working with Flint Melter, MD found by Gwenyth Ober, ED scribe. This patient was seen in room APFT20/APFT20 and the patient's care was started at [facility] PM   Chief Complaint  Patient presents with  . Cough   The history is provided by the patient. No language interpreter was used.    HPI Comments: Tammy Scott is a 39 y.o. female who presents to the Emergency Department complaining of constant, gradually worsening cough and sore throat that started 2 weeks ago. She states mild left ear pain, intermittent hoarse voice and sinus congestion that started today as associated symptoms. Pt reports that symptoms started as a dry, hacking cough that has progressed  to sore throat and congestion. She also states white sputum.  Pt denies fever, chills, CP and SOB as associated symptoms.  She has not tried any OTC medications for symtpom relief.  Past Medical History  Diagnosis Date  . Abnormal pap   . Chlamydia   . History of gonorrhea    Past Surgical History  Procedure Laterality Date  . Wisdom tooth extraction    . Colyposcopy    . Colonoscopy N/A 04/30/2013    Procedure: COLONOSCOPY;  Surgeon: West Bali, MD;  Location: [facility];  Service: Endoscopy;  Laterality: N/A;  1:15   Family History  Problem Relation Age of Onset  . Cancer Mother     breast  . Colon polyps Mother    History  Substance Use Topics  . Smoking status: Former Games developer  . Smokeless tobacco: Not on file  . Alcohol Use: Yes     Comment: socially   OB History    Gravida Para Term Preterm AB TAB SAB Ectopic Multiple Living   Review of Systems  Constitutional: Negative for fever, chills, activity change and appetite change.  HENT: Positive for congestion, rhinorrhea, sinus pressure, sore throat and voice change. Negative for facial swelling and  trouble swallowing.   Eyes: Negative for visual disturbance.  Respiratory: Positive for cough. Negative for chest tightness, shortness of breath, wheezing and stridor.   Cardiovascular: Negative for chest pain.  Gastrointestinal: Negative for nausea and vomiting.  Genitourinary: Negative for dysuria, hematuria and flank pain.  Musculoskeletal: Negative for neck pain and neck stiffness.  Skin: Negative.  Negative for rash.  Neurological: Negative for dizziness, weakness, numbness and headaches.  Hematological: Negative for adenopathy.  Psychiatric/Behavioral: Negative for confusion.  All other systems reviewed and are negative.  Allergies  Review of patient's allergies indicates no known allergies.  Home Medications   Prior to Admission medications   Medication Sig Start Date End Date Taking? Authorizing Provider  cetirizine (ZYRTEC) 10 MG tablet Take 10 mg by mouth daily.    Historical Provider, MD  cholecalciferol (VITAMIN D) 1000 UNITS tablet Take 1,000 Units by mouth daily.    Historical Provider, MD  hydrocortisone (ANUSOL-HC) 2.5 % rectal cream Place 1 application rectally 2 (two) times daily. 04/22/13   Nira Retort, NP  medroxyPROGESTERone (DEPO-PROVERA) 150 MG/ML injection Inject 150 mg into the muscle every 3 (three) months.    Historical Provider, MD  Multiple Vitamin (MULTIVITAMIN) tablet Take 1 tablet by mouth daily.    Historical Provider, MD  naproxen (NAPROSYN) 250 MG tablet Take 1 tablet (250 mg total)  by mouth 2 (two) times daily with a meal. 07/06/13   Samuel Jester, DO   BP 129/81 mmHg  Pulse 89  Temp(Src) 99.4 F (37.4 C) (Oral)  Resp 18  Ht  (1.676 m)  Wt 210 lb (95.255 kg)  BMI 33.91 kg/m2  SpO2 100% Physical Exam  Constitutional: She is oriented to person, place, and time. She appears well-developed and well-nourished. No distress.  HENT:  Head: Normocephalic and atraumatic.  Right Ear: Tympanic membrane and ear canal normal.  Left Ear: No mastoid  tenderness. Tympanic membrane is not erythematous and not bulging. No hemotympanum.  Nose: Mucosal edema and rhinorrhea present.  Mouth/Throat: Uvula is midline and mucous membranes are normal. No oral lesions. No trismus in the jaw. No uvula swelling. Posterior oropharyngeal erythema present. No oropharyngeal exudate, posterior oropharyngeal edema or tonsillar abscesses.  Mild air fluid levels of left TM, no erythema; oropharynx is mildy erythematous, no edema, no tonsillar exudate.  No PTA  Eyes: Conjunctivae are normal. Pupils are equal, round, and reactive to light.  Neck: Normal range of motion and phonation normal. Neck supple. No Brudzinski's sign and no Kernig's sign noted.  Cardiovascular: Normal rate, regular rhythm, normal heart sounds and intact distal pulses.   No murmur heard. Pulmonary/Chest: Effort normal and breath sounds normal. No respiratory distress. She has no wheezes. She has no rales.  Abdominal: Soft. She exhibits no distension. There is no tenderness. There is no rebound and no guarding.  Musculoskeletal: Normal range of motion. She exhibits no edema.  Lymphadenopathy:    She has no cervical adenopathy.  Neurological: She is alert and oriented to person, place, and time. No cranial nerve deficit. She exhibits normal muscle tone. Coordination normal.  Skin: Skin is warm and dry. No rash noted.  Psychiatric: She has a normal mood and affect. Her behavior is normal.  Nursing note and vitals reviewed.   ED Course  Procedures (including critical care time) DIAGNOSTIC STUDIES: Oxygen Saturation is 100% on RA, normal by my interpretation.    COORDINATION OF CARE: 6:20 PM Discussed treatment plan with pt at bedside and pt agreed to plan.   Labs Review Labs Reviewed - No data to display  Imaging Review No results found.   EKG Interpretation None      MDM   Final diagnoses:  URI (upper respiratory infection)  Acute serous otitis media of left ear, recurrence  not specified    Pt well appearing, VSS.  Likely viral process.  She agrees to symtpomatic treatment   I personally performed the services described in this documentation, which was scribed in my presence. The recorded information has been reviewed and is accurate.   Tobe Kervin L. Trisha Mangle, PA-C 06/13/14 0046  Flint Melter, MD 06/15/14 [phone]

## 2014-06-12 NOTE — ED Notes (Signed)
Pt reports cough and sore throat x2 weeks. nad noted.

## 2014-12-18 ENCOUNTER — Emergency Department (HOSPITAL_COMMUNITY)
Admission: EM | Admit: 2014-12-18 | Discharge: 2014-12-18 | Disposition: A | Discharge status: Home / Self Care | Payer: BLUE CROSS/BLUE SHIELD | Attending: Emergency Medicine | Admitting: Emergency Medicine

## 2014-12-18 ENCOUNTER — Encounter (HOSPITAL_COMMUNITY): Payer: Self-pay | Admitting: Emergency Medicine

## 2014-12-18 DIAGNOSIS — Z7952 Long term (current) use of systemic steroids: Secondary | ICD-10-CM | POA: Diagnosis not present

## 2014-12-18 DIAGNOSIS — Z79899 Other long term (current) drug therapy: Secondary | ICD-10-CM | POA: Diagnosis not present

## 2014-12-18 DIAGNOSIS — Y9389 Activity, other specified: Secondary | ICD-10-CM | POA: Diagnosis not present

## 2014-12-18 DIAGNOSIS — Z791 Long term (current) use of non-steroidal anti-inflammatories (NSAID): Secondary | ICD-10-CM | POA: Diagnosis not present

## 2014-12-18 DIAGNOSIS — X58XXXA Exposure to other specified factors, initial encounter: Secondary | ICD-10-CM | POA: Insufficient documentation

## 2014-12-18 DIAGNOSIS — S46912A Strain of unspecified muscle, fascia and tendon at shoulder and upper arm level, left arm, initial encounter: Secondary | ICD-10-CM | POA: Insufficient documentation

## 2014-12-18 DIAGNOSIS — Z8619 Personal history of other infectious and parasitic diseases: Secondary | ICD-10-CM | POA: Diagnosis not present

## 2014-12-18 DIAGNOSIS — S4992XA Unspecified injury of left shoulder and upper arm, initial encounter: Secondary | ICD-10-CM | POA: Diagnosis present

## 2014-12-18 DIAGNOSIS — Z87891 Personal history of nicotine dependence: Secondary | ICD-10-CM | POA: Insufficient documentation

## 2014-12-18 DIAGNOSIS — Y9289 Other specified places as the place of occurrence of the external cause: Secondary | ICD-10-CM | POA: Diagnosis not present

## 2014-12-18 DIAGNOSIS — Y990 Civilian activity done for income or pay: Secondary | ICD-10-CM | POA: Diagnosis not present

## 2014-12-18 DIAGNOSIS — S199XXA Unspecified injury of neck, initial encounter: Secondary | ICD-10-CM | POA: Insufficient documentation

## 2014-12-18 MED ORDER — NAPROXEN 500 MG PO TABS: 500.0000 mg | ORAL_TABLET | Freq: Two times a day (BID) | ORAL | Status: DC

## 2014-12-18 MED ORDER — METHOCARBAMOL 500 MG PO TABS
ORAL_TABLET | ORAL | Status: AC
  Filled 2014-12-18: qty 1

## 2014-12-18 MED ORDER — METHOCARBAMOL 500 MG PO TABS: 500.0000 mg | ORAL_TABLET | Freq: Three times a day (TID) | ORAL | Status: DC

## 2014-12-18 MED ORDER — METHOCARBAMOL 500 MG PO TABS
500.0000 mg | ORAL_TABLET | Freq: Once | ORAL | Status: AC
  Administered 2014-12-18: 500 mg via ORAL

## 2014-12-18 NOTE — ED Provider Notes (Signed)
CSN: [id number]     Arrival date & time 12/18/14  1749 History   This chart was scribed for Tammy Ausammy Nuel Dejaynes, PA-C working with Dr. Viviann SpareSteven Rancour by Elveria Risingimelie Horne, ED Scribe. This patient was seen in room APFT21/APFT21 and the patient's care was started at 6:37 PM.   Chief Complaint  Patient presents with  . Shoulder Pain   The history is provided by the patient. No language interpreter was used.   HPI Comments: Tammy Scott is a 39 y.o. female who presents to the Emergency Department complaining of burning left shoulder and neck upon wakening this morning. Patient reports lifting boxes last night at her job, but is unable to recall specific injury. Patient reports tightness and a "gripping sensation" that is exacerbated with certain movements of her left arm. Patient denies radiation of pain into her left arm. Patient reports treatment with heat, ice and 800mg  ibuprofen today, without complete relief of her pain. Patient shares that she has an upcoming appointment with her PCP in four days. She denies fever, redness, swelling, numbness or weakness to the left arm, chest pain and dizziness.  Past Medical History  Diagnosis Date  . Abnormal pap   . Chlamydia   . History of gonorrhea    Past Surgical History  Procedure Laterality Date  . Wisdom tooth extraction    . Colyposcopy    . Colonoscopy N/A 04/30/2013    Procedure: COLONOSCOPY;  Surgeon: West BaliSandi L Fields, MD;  Location: [facility];  Service: Endoscopy;  Laterality: N/A;  1:15   Family History  Problem Relation Age of Onset  . Cancer Mother     breast  . Colon polyps Mother    History  Substance Use Topics  . Smoking status: Former Smoker -- 1.00 packs/day for 3 years    Types: Cigarettes    Quit date: 06/10/2005  . Smokeless tobacco: Never Used  . Alcohol Use: Yes     Comment: socially   OB History    Gravida Para Term Preterm AB TAB SAB Ectopic Multiple Living   3 2 2  1  1   2      Review of Systems   Constitutional: Negative for fever and chills.  Respiratory: Negative for shortness of breath.   Cardiovascular: Negative for chest pain.  Genitourinary: Negative for dysuria and difficulty urinating.  Musculoskeletal: Positive for arthralgias (left shoulder pain) and neck pain. Negative for joint swelling and neck stiffness.  Skin: Negative for color change and wound.  Neurological: Negative for dizziness, weakness, numbness and headaches.  All other systems reviewed and are negative.  Allergies  Review of patient's allergies indicates no known allergies.  Home Medications   Prior to Admission medications   Medication Sig Start Date End Date Taking? Authorizing Provider  Alum & Mag Hydroxide-Simeth (MAGIC MOUTHWASH W/LIDOCAINE) SOLN Take 5 mLs by mouth 3 (three) times daily as needed for mouth pain. Swish and spit, do not swallow 06/12/14   Inocencio Roy, PA-C  cetirizine (ZYRTEC) 10 MG tablet Take 10 mg by mouth daily.    Historical Provider, MD  cholecalciferol (VITAMIN D) 1000 UNITS tablet Take 1,000 Units by mouth daily.    Historical Provider, MD  hydrocortisone (ANUSOL-HC) 2.5 % rectal cream Place 1 application rectally 2 (two) times daily. 04/22/13   Nira RetortAnna W Sams, NP  medroxyPROGESTERone (DEPO-PROVERA) 150 MG/ML injection Inject 150 mg into the muscle every 3 (three) months.    Historical Provider, MD  Multiple Vitamin (MULTIVITAMIN) tablet Take 1  tablet by mouth daily.    Historical Provider, MD  naproxen (NAPROSYN) 250 MG tablet Take 1 tablet (250 mg total) by mouth 2 (two) times daily with a meal. 07/06/13   Samuel Jester, DO  predniSONE (DELTASONE) 10 MG tablet Take 6 tablets day one, 5 tablets day two, 4 tablets day three, 3 tablets day four, 2 tablets day five, then 1 tablet day six 06/12/14   Mailin Coglianese, PA-C   Triage Vitals: BP 114/69 mmHg  Pulse 62  Temp(Src) 98.9 F (37.2 C) (Oral)  Resp 19  Ht  (1.676 m)  Wt 230 lb (104.327 kg)  BMI 37.14 kg/m2  SpO2  95% Physical Exam  Constitutional: She is oriented to person, place, and time. She appears well-developed and well-nourished. No distress.  HENT:  Head: Normocephalic and atraumatic.  Eyes: EOM are normal.  Neck: Phonation normal. Neck supple. Muscular tenderness present. No tracheal deviation present. No Kernig's sign noted.    Cardiovascular: Normal rate, regular rhythm and intact distal pulses.   Pulmonary/Chest: Effort normal. No respiratory distress.  Musculoskeletal: Normal range of motion.       Left shoulder: She exhibits tenderness. She exhibits no bony tenderness, normal pulse and normal strength.  Localized tenderness to the left paracervical and trapezius muscles. Patient with mild tenderness to left scapular border. Pain reproduced with abduction. Grip strength is strong and symmetrical.   Neurological: She is alert and oriented to person, place, and time.  Pulse and sensation intact.   Skin: Skin is warm and dry.  Psychiatric: She has a normal mood and affect. Her behavior is normal.  Nursing note and vitals reviewed.   ED Course  Procedures (including critical care time)  COORDINATION OF CARE: 6:41 PM- Discussed treatment plan with patient at bedside and patient agreed to plan.   Labs Review Labs Reviewed - No data to display  Imaging Review No results found.   EKG Interpretation None      MDM   Final diagnoses:  Shoulder strain, left, initial encounter   Pain to left paracervical muscles.  Likely strain secondary to lifting.  No concerning neurological symptoms or concern for septic joint.  Pt agrees to symptomatic tx and close PMD or orthopedic f/u if needed.     I personally performed the services described in this documentation, which was scribed in my presence. The recorded information has been reviewed and is accurate.    Tammy Aus, PA-C 12/20/14 1233  Glynn Octave, MD 12/20/14 [phone]

## 2014-12-18 NOTE — Discharge Instructions (Signed)

## 2014-12-18 NOTE — ED Notes (Signed)
Patient c/o left shoulder pain that she woke with. Patient woke with stiffness and pain in neck to left shoulder. Patient states feels like left shoulder is burning. Reports using heating pack, making pain worse, took  ibuprofen with no relief. Denies any chest pain. Per patient pain not made worse by palpitation. No rash noted.

## 2015-09-25 ENCOUNTER — Other Ambulatory Visit: Payer: Self-pay

## 2015-09-25 ENCOUNTER — Other Ambulatory Visit (HOSPITAL_COMMUNITY)
Admission: RE | Admit: 2015-09-25 | Discharge: 2015-09-25 | Disposition: A | Discharge status: Home / Self Care | Payer: BLUE CROSS/BLUE SHIELD | Source: Ambulatory Visit | Attending: Family | Admitting: Family

## 2015-09-25 DIAGNOSIS — Z113 Encounter for screening for infections with a predominantly sexual mode of transmission: Secondary | ICD-10-CM | POA: Insufficient documentation

## 2015-09-25 DIAGNOSIS — Z01419 Encounter for gynecological examination (general) (routine) without abnormal findings: Secondary | ICD-10-CM | POA: Diagnosis not present

## 2015-09-25 DIAGNOSIS — N760 Acute vaginitis: Secondary | ICD-10-CM | POA: Insufficient documentation

## 2015-09-26 LAB — CYTOLOGY - PAP

## 2015-11-15 ENCOUNTER — Emergency Department (HOSPITAL_COMMUNITY)
Admission: EM | Admit: 2015-11-15 | Discharge: 2015-11-15 | Disposition: A | Discharge status: Home / Self Care | Payer: BLUE CROSS/BLUE SHIELD | Attending: Emergency Medicine | Admitting: Emergency Medicine

## 2015-11-15 ENCOUNTER — Encounter (HOSPITAL_COMMUNITY): Payer: Self-pay | Admitting: Emergency Medicine

## 2015-11-15 DIAGNOSIS — Z79899 Other long term (current) drug therapy: Secondary | ICD-10-CM | POA: Insufficient documentation

## 2015-11-15 DIAGNOSIS — R51 Headache: Secondary | ICD-10-CM | POA: Insufficient documentation

## 2015-11-15 DIAGNOSIS — R112 Nausea with vomiting, unspecified: Secondary | ICD-10-CM | POA: Diagnosis not present

## 2015-11-15 DIAGNOSIS — H9201 Otalgia, right ear: Secondary | ICD-10-CM | POA: Insufficient documentation

## 2015-11-15 DIAGNOSIS — R519 Headache, unspecified: Secondary | ICD-10-CM

## 2015-11-15 DIAGNOSIS — Z87891 Personal history of nicotine dependence: Secondary | ICD-10-CM | POA: Insufficient documentation

## 2015-11-15 MED ORDER — KETOROLAC TROMETHAMINE 30 MG/ML IJ SOLN
30.0000 mg | Freq: Once | INTRAMUSCULAR | Status: AC
  Administered 2015-11-15: 30 mg via INTRAVENOUS
  Filled 2015-11-15: qty 1

## 2015-11-15 MED ORDER — ONDANSETRON HCL 4 MG/2ML IJ SOLN
4.0000 mg | Freq: Once | INTRAMUSCULAR | Status: AC
  Administered 2015-11-15: 4 mg via INTRAVENOUS
  Filled 2015-11-15: qty 2

## 2015-11-15 MED ORDER — ONDANSETRON HCL 4 MG PO TABS: 4.0000 mg | ORAL_TABLET | Freq: Four times a day (QID) | ORAL | Status: DC

## 2015-11-15 MED ORDER — OXYMETAZOLINE HCL 0.05 % NA SOLN
1.0000 | Freq: Once | NASAL | Status: AC
  Administered 2015-11-15: 1 via NASAL
  Filled 2015-11-15: qty 15

## 2015-11-15 MED ORDER — SODIUM CHLORIDE 0.9 % IV BOLUS (SEPSIS)
1000.0000 mL | Freq: Once | INTRAVENOUS | Status: AC
  Administered 2015-11-15: 1000 mL via INTRAVENOUS

## 2015-11-15 NOTE — ED Provider Notes (Signed)
CSN: [id number]     Arrival date & time 11/15/15  [id number] History   By signing my name below, I, Iona BeardChristian Pulliam, attest that this documentation has been prepared under the direction and in the presence of Jacalyn LefevreJulie Ugochukwu Chichester, MD.   Electronically Signed: Iona Beardhristian Pulliam, ED Scribe. 11/15/2015. 10:12 AM   Chief Complaint  Patient presents with  . Headache    The history is provided by the patient. No language interpreter was used.   HPI Comments: Colin Muldersracie L Roswell is a 40 y.o. female who presents to the Emergency Department complaining of gradual onset, headache, ongoing since yesterday at 3 PM. Pt said the pain radiates down into her right ear and face. Pt reports nausea and emesis, which began after eating last night. Pt also complains of facial pain. No other associated symptoms noted. Pt has taken tylenol with no relief to symptoms. She has also tried an ice mask and allegra with no relief to symptoms. No other worsening or alleviating factors noted. Pt denies weakness, or any other pertinent symptoms.  Past Medical History  Diagnosis Date  . Abnormal pap   . Chlamydia   . History of gonorrhea    Past Surgical History  Procedure Laterality Date  . Wisdom tooth extraction    . Colyposcopy    . Colonoscopy N/A 04/30/2013    Procedure: COLONOSCOPY;  Surgeon: West BaliSandi L Fields, MD;  Location: [facility];  Service: Endoscopy;  Laterality: N/A;  1:15   Family History  Problem Relation Age of Onset  . Cancer Mother     breast  . Colon polyps Mother    Social History  Substance Use Topics  . Smoking status: Former Smoker -- 1.00 packs/day for 3 years    Types: Cigarettes    Quit date: 06/10/2005  . Smokeless tobacco: Never Used  . Alcohol Use: Yes     Comment: socially   OB History    Gravida Para Term Preterm AB TAB SAB Ectopic Multiple Living   3 2 2  1  1   2      Review of Systems  HENT: Positive for ear pain.   Eyes: Positive for photophobia.  Gastrointestinal: Positive  for vomiting.  Neurological: Positive for headaches. Negative for weakness.  All other systems reviewed and are negative.    Allergies  Review of patient's allergies indicates no known allergies.  Home Medications   Prior to Admission medications   Medication Sig Start Date End Date Taking? Authorizing Provider  acetaminophen (TYLENOL) 500 MG tablet Take 1,000 mg by mouth every 6 (six) hours as needed for moderate pain or headache.   Yes Historical Provider, MD  cholecalciferol (VITAMIN D) 1000 UNITS tablet Take 1,000 Units by mouth daily.   Yes Historical Provider, MD  fexofenadine (ALLEGRA) 180 MG tablet Take 320 mg by mouth daily as needed for allergies or rhinitis.   Yes Historical Provider, MD  levonorgestrel (MIRENA) 20 MCG/24HR IUD 1 each by Intrauterine route once.   Yes Historical Provider, MD  medroxyPROGESTERone (DEPO-PROVERA) 150 MG/ML injection Inject 150 mg into the muscle every 3 (three) months.   Yes Historical Provider, MD  Multiple Vitamin (MULTIVITAMIN) tablet Take 1 tablet by mouth daily.   Yes Historical Provider, MD  ondansetron (ZOFRAN) 4 MG tablet Take 1 tablet (4 mg total) by mouth every 6 (six) hours. 11/15/15   Jacalyn LefevreJulie Kline Bulthuis, MD   BP 141/93 mmHg  Pulse 64  Temp(Src) 98.3 F (36.8 C) (Oral)  Resp 18  Ht   (1.626 m)  Wt 210 lb (95.255 kg)  BMI 36.03 kg/m2  SpO2 96% Physical Exam  Constitutional: She is oriented to person, place, and time. She appears well-developed and well-nourished.  HENT:  Head: Normocephalic.  Right Ear: External ear normal.  Left Ear: External ear normal.  Nose: Right sinus exhibits maxillary sinus tenderness. Left sinus exhibits maxillary sinus tenderness.  Maxillary sinus tenderness noted.  Eyes: EOM are normal.  Neck: Normal range of motion.  Cardiovascular: Normal rate, regular rhythm and normal heart sounds.  Exam reveals no gallop.   No murmur heard. Pulmonary/Chest: Effort normal and breath sounds normal. No  respiratory distress. She has no wheezes. She has no rales.  Abdominal: Soft. Bowel sounds are normal. She exhibits no distension. There is no tenderness.  Musculoskeletal: Normal range of motion.  Neurological: She is alert and oriented to person, place, and time.  Psychiatric: She has a normal mood and affect.  Nursing note and vitals reviewed.   ED Course  Procedures (including critical care time) DIAGNOSTIC STUDIES: Oxygen Saturation is 96% on RA, normal by my interpretation.    COORDINATION OF CARE: 9:02 AM Discussed treatment plan with pt at bedside and pt agreed to plan.  Labs Review Labs Reviewed - No data to display  Imaging Review No results found. I have personally reviewed and evaluated these images and lab results as part of my medical decision-making.   EKG Interpretation None      MDM  Pt is feeling much better.  She knows to return if worse and to f/u with her pcp. Final diagnoses:  Acute nonintractable headache, unspecified headache type   I personally performed the services described in this documentation, which was scribed in my presence. The recorded information has been reviewed and is accurate.     Jacalyn Lefevre, MD 11/15/15 1013

## 2015-11-15 NOTE — ED Notes (Addendum)
Pt reports h/a since yesterday, pt reports she thought it was a sinus h/a, but h/a continues into today.  Pt denies unilateral weakness.  Pt has taken allegra with little relief.  Pt has 2 episodes of emesis..Marland Kitchen

## 2015-11-15 NOTE — Discharge Instructions (Signed)

## 2016-04-16 ENCOUNTER — Encounter: Payer: Self-pay | Admitting: Neurology

## 2016-04-16 ENCOUNTER — Ambulatory Visit (INDEPENDENT_AMBULATORY_CARE_PROVIDER_SITE_OTHER): Payer: BLUE CROSS/BLUE SHIELD | Admitting: Neurology

## 2016-04-16 VITALS — BP 122/70 | HR 77 | Ht 66.0 in | Wt 198.9 lb

## 2016-04-16 DIAGNOSIS — E237 Disorder of pituitary gland, unspecified: Secondary | ICD-10-CM | POA: Diagnosis not present

## 2016-04-16 DIAGNOSIS — R930 Abnormal findings on diagnostic imaging of skull and head, not elsewhere classified: Secondary | ICD-10-CM

## 2016-04-16 NOTE — Patient Instructions (Signed)
Often, it is something benign, such as a cyst. To get a better image, we will get MRI of brain with and without contrast with attention to the pituitary gland We will contact you with results and what further testing (if any) would be required.

## 2016-04-16 NOTE — Progress Notes (Signed)
NEUROLOGY CONSULTATION NOTE  Tammy Scott MRN: [id number] DOB: 10/29/1975  Referring provider: Damien FusiHunter Dawson, DMD, MSD Primary care provider: Boneta LucksJennifer Brown, NP  Reason for consult:  Abnormality of pituitary gland.  HISTORY OF PRESENT ILLNESS: Tammy Scott is a 40 year old woman who presents for pituitary mass.  History obtained by patient and dental records.  Prior to receiving dental work, she had a baseline CT of the sinuses.  She had a CT of the sinuses on 01/31/16, which revealed "enlargement of the sella turcica" measuring "10.57 mm x 19.28 mm" and "appears hydrostatic in character.  It extends inferiorly into the sphenoid sinus and demonstrates thinning and expansion of the intact cortical borders."  Findings said to be consistent with anatomical variation of normal or a cystic mass in the area of the pituitary such as a Rathke's cleft cyst, a cystic pituitary adenoma or a craniopharyngioma.  She has worn glasses since 40 years old, but denies new vision changes.  She has some tension headaches but nothing severe.  She has been on birth control for many years and so has not had a regular period in over 6 years.  She denies galactorrhea, extreme changes in blood pressure or extreme change in weight.  PAST MEDICAL HISTORY: Past Medical History:  Diagnosis Date  . Abnormal pap   . Chlamydia   . History of gonorrhea     PAST SURGICAL HISTORY: Past Surgical History:  Procedure Laterality Date  . COLONOSCOPY N/A 04/30/2013   Procedure: COLONOSCOPY;  Surgeon: West BaliSandi L Fields, MD;  Location: [facility];  Service: Endoscopy;  Laterality: N/A;  1:15  . Colyposcopy    . WISDOM TOOTH EXTRACTION      MEDICATIONS: Current Outpatient Prescriptions on File Prior to Visit  Medication Sig Dispense Refill  . acetaminophen (TYLENOL) 500 MG tablet Take 1,000 mg by mouth every 6 (six) hours as needed for moderate pain or headache.    . cholecalciferol (VITAMIN D) 1000 UNITS tablet  Take 1,000 Units by mouth daily.    . fexofenadine (ALLEGRA) 180 MG tablet Take 320 mg by mouth daily as needed for allergies or rhinitis.    Marland Kitchen. levonorgestrel (MIRENA) 20 MCG/24HR IUD 1 each by Intrauterine route once.    . Multiple Vitamin (MULTIVITAMIN) tablet Take 1 tablet by mouth daily.     No current facility-administered medications on file prior to visit.     ALLERGIES: No Known Allergies  FAMILY HISTORY: Family History  Problem Relation Age of Onset  . Cancer Mother     breast  . Colon polyps Mother     SOCIAL HISTORY: Social History   Social History  . Marital status: Single    Spouse name: N/A  . Number of children: N/A  . Years of education: N/A   Occupational History  . full-time Consulting civil engineerstudent   . [employer]    Social History Main Topics  . Smoking status: Former Smoker    Packs/day: 1.00    Years: 3.00    Types: Cigarettes    Quit date: 06/10/2005  . Smokeless tobacco: Never Used  . Alcohol use Yes     Comment: socially  . Drug use: No  . Sexual activity: Yes    Birth control/ protection: Condom   Other Topics Concern  . Not on file   Social History Narrative  . No narrative on file    REVIEW OF SYSTEMS: Constitutional: No fevers, chills, or sweats, no generalized fatigue, change in appetite Eyes:  No visual changes, double vision, eye pain Ear, nose and throat: No hearing loss, ear pain, nasal congestion, sore throat Cardiovascular: No chest pain, palpitations Respiratory:  No shortness of breath at rest or with exertion, wheezes GastrointestinaI: No nausea, vomiting, diarrhea, abdominal pain, fecal incontinence Genitourinary:  No dysuria, urinary retention or frequency Musculoskeletal:  No neck pain, back pain Integumentary: No rash, pruritus, skin lesions Neurological: as above Psychiatric: No depression, insomnia, anxiety Endocrine: No palpitations, fatigue, diaphoresis, mood swings, change in appetite, change in weight, increased  thirst Hematologic/Lymphatic:  No purpura, petechiae. Allergic/Immunologic: no itchy/runny eyes, nasal congestion, recent allergic reactions, rashes  PHYSICAL EXAM: Vitals:   04/16/16 0757  BP: 122/70  Pulse: 77   General: No acute distress.  Patient appears well-groomed.  Head:  Normocephalic/atraumatic Eyes:  fundi examined but not visualized Neck: supple, no paraspinal tenderness, full range of motion Back: No paraspinal tenderness Heart: regular rate and rhythm Lungs: Clear to auscultation bilaterally. Vascular: No carotid bruits. Neurological Exam: Mental status: alert and oriented to person, place, and time, recent and remote memory intact, fund of knowledge intact, attention and concentration intact, speech fluent and not dysarthric, language intact. Cranial nerves: CN I: not tested CN II: pupils equal, round and reactive to light, visual fields intact CN III, IV, VI:  full range of motion, no nystagmus, no ptosis CN V: facial sensation intact CN VII: upper and lower face symmetric CN VIII: hearing intact CN IX, X: gag intact, uvula midline CN XI: sternocleidomastoid and trapezius muscles intact CN XII: tongue midline Bulk & Tone: normal, no fasciculations. Motor:  5/5 throughout  Sensation: temperature and vibration sensation intact. Deep Tendon Reflexes:  2+ throughout, toes downgoing.  Finger to nose testing:  Without dysmetria.  Heel to shin:  Without dysmetria.  Gait:  Normal station and stride.  Able to turn and tandem walk. Romberg negative.  IMPRESSION: Abnormal finding of pituitary on CT, incidental finding.  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  She is asymptomatic, so benign process is likely.  PLAN: We will get MRI of brain with and without contrast with attention to the pituitary Further recommendations pending results.   Thank you for allowing me to take part in the care of this patient.  Shon MilletAdam Tisa Weisel, DO  CC:  Damien FusiHunter Dawson, DMD, MSD  Boneta LucksJennifer  Brown, NP

## 2016-04-16 NOTE — Progress Notes (Signed)
Chart forwarded.  

## 2016-05-04 ENCOUNTER — Ambulatory Visit
Admission: RE | Admit: 2016-05-04 | Discharge: 2016-05-04 | Disposition: A | Discharge status: Home / Self Care | Payer: BLUE CROSS/BLUE SHIELD | Source: Ambulatory Visit | Attending: Neurology | Admitting: Neurology

## 2016-05-04 DIAGNOSIS — E237 Disorder of pituitary gland, unspecified: Secondary | ICD-10-CM

## 2016-05-04 DIAGNOSIS — R930 Abnormal findings on diagnostic imaging of skull and head, not elsewhere classified: Secondary | ICD-10-CM

## 2016-05-04 MED ORDER — GADOBENATE DIMEGLUMINE 529 MG/ML IV SOLN
15.0000 mL | Freq: Once | INTRAVENOUS | Status: AC | PRN
  Administered 2016-05-04: 14 mL via INTRAVENOUS

## 2016-05-06 ENCOUNTER — Telehealth: Payer: Self-pay

## 2016-05-06 NOTE — Telephone Encounter (Signed)
Left message on machine for pt to return call to the office on mobile number

## 2016-05-06 NOTE — Telephone Encounter (Signed)
-----   Message from Drema DallasAdam R Jaffe, DO sent at 05/05/2016 11:25 AM EST ----- Reviewed MRI.  There is no tumor.  It reveals an area of increased spinal fluid, which is usually a normal variant.  It is nothing concerning.  No further workup or follow up needed.

## 2016-05-06 NOTE — Telephone Encounter (Signed)
Pt called back and message was relayed

## 2017-10-20 ENCOUNTER — Emergency Department: Payer: Self-pay

## 2017-10-20 ENCOUNTER — Other Ambulatory Visit: Payer: Self-pay

## 2017-10-20 ENCOUNTER — Encounter: Payer: Self-pay | Admitting: Emergency Medicine

## 2017-10-20 ENCOUNTER — Emergency Department
Admission: EM | Admit: 2017-10-20 | Discharge: 2017-10-20 | Disposition: A | Discharge status: Home / Self Care | Payer: Self-pay | Attending: Emergency Medicine | Admitting: Emergency Medicine

## 2017-10-20 DIAGNOSIS — R0789 Other chest pain: Secondary | ICD-10-CM | POA: Insufficient documentation

## 2017-10-20 DIAGNOSIS — Z79899 Other long term (current) drug therapy: Secondary | ICD-10-CM | POA: Insufficient documentation

## 2017-10-20 DIAGNOSIS — Z87891 Personal history of nicotine dependence: Secondary | ICD-10-CM | POA: Insufficient documentation

## 2017-10-20 LAB — BASIC METABOLIC PANEL
ANION GAP: 6 (ref 5–15)
BUN: 8 mg/dL (ref 6–20)
CO2: 29 mmol/L (ref 22–32)
Calcium: 8.7 mg/dL — ABNORMAL LOW (ref 8.9–10.3)
Chloride: 103 mmol/L (ref 101–111)
Creatinine, Ser: 0.67 mg/dL (ref 0.44–1.00)
GFR calc non Af Amer: >60 mL/min (ref >60)
GLUCOSE: 96 mg/dL (ref 65–99)
Potassium: 3.4 mmol/L — ABNORMAL LOW (ref 3.5–5.1)
Sodium: 138 mmol/L (ref 135–145)

## 2017-10-20 LAB — CBC
HCT: 37 % (ref 35.0–47.0)
HEMOGLOBIN: 12.5 g/dL (ref 12.0–16.0)
MCH: 30.9 pg (ref 26.0–34.0)
MCHC: 33.8 g/dL (ref 32.0–36.0)
MCV: 91.6 fL (ref 80.0–100.0)
PLATELETS: 273 10*3/uL (ref 150–440)
RBC: 4.04 MIL/uL (ref 3.80–5.20)
RDW: 13.5 % (ref 11.5–14.5)
WBC: 3.8 10*3/uL (ref 3.6–11.0)

## 2017-10-20 LAB — TROPONIN I

## 2017-10-20 MED ORDER — KETOROLAC TROMETHAMINE 30 MG/ML IJ SOLN
30.0000 mg | Freq: Once | INTRAMUSCULAR | Status: AC
  Administered 2017-10-20: 30 mg via INTRAMUSCULAR
  Filled 2017-10-20: qty 1

## 2017-10-20 MED ORDER — GI COCKTAIL ~~LOC~~
30.0000 mL | Freq: Once | ORAL | Status: AC
  Administered 2017-10-20: 30 mL via ORAL
  Filled 2017-10-20: qty 30

## 2017-10-20 MED ORDER — NAPROXEN 500 MG PO TABS: 500.0000 mg | ORAL_TABLET | Freq: Two times a day (BID) | ORAL | 2 refills | Status: DC

## 2017-10-20 NOTE — ED Provider Notes (Signed)
[facility] Emergency Department Provider Note   ____________________________________________    I have reviewed the triage vital signs and the nursing notes.   HISTORY  Chief Complaint Chest Pain     HPI Tammy Scott is a 42 y.o. female who presents with complaints of intermittent chest discomfort over the last week.  She states she has had pain in the center of her chest over her sternum when she has moved in particular directions intermittently over the last week, she reports it became more constant yesterday evening.  She reports if she lifts her arms up she has pain in the center of her chest around her sternum, she reports if she twists to the left of the right she has discomfort there as well, she scrubs as an aching pain.  No history of heart disease.  Does not smoke cigarettes.  No fevers or chills or shortness of breath.  No pleurisy no calf pain or swelling.  Not taken anything for this  Past Medical History:  Diagnosis Date  . Abnormal pap   . Chlamydia   . History of gonorrhea     Patient Active Problem List   Diagnosis Date Noted  . Hematochezia 04/22/2013  . Urinary tract infection symptoms 04/22/2013    Past Surgical History:  Procedure Laterality Date  . COLONOSCOPY N/A 04/30/2013   Procedure: COLONOSCOPY;  Surgeon: West Bali, MD;  Location: [facility];  Service: Endoscopy;  Laterality: N/A;  1:15  . Colyposcopy    . WISDOM TOOTH EXTRACTION      Prior to Admission medications   Medication Sig Start Date End Date Taking? Authorizing Provider  acetaminophen (TYLENOL) 500 MG tablet Take 1,000 mg by mouth every 6 (six) hours as needed for moderate pain or headache.    [provider]  cholecalciferol (VITAMIN D) 1000 UNITS tablet Take 1,000 Units by mouth daily.    [provider]  fexofenadine (ALLEGRA) 180 MG tablet Take 320 mg by mouth daily as needed for allergies or rhinitis.    [provider]  levonorgestrel (MIRENA) 20 MCG/24HR IUD 1 each by Intrauterine route once.    [provider]  Multiple Vitamin (MULTIVITAMIN) tablet Take 1 tablet by mouth daily.    [provider]  naproxen (NAPROSYN) 500 MG tablet Take 1 tablet (500 mg total) by mouth 2 (two) times daily with a meal. 10/20/17   Jene Every, MD     Allergies Patient has no known allergies.  Family History  Problem Relation Age of Onset  . Cancer Mother        breast  . Colon polyps Mother     Social History Social History   Tobacco Use  . Smoking status: Former Smoker    Packs/day: 1.00    Years: 3.00    Pack years: 3.00    Types: Cigarettes    Last attempt to quit: 06/10/2005    Years since quitting: 12.3  . Smokeless tobacco: Never Used  Substance Use Topics  . Alcohol use: Yes    Comment: socially  . Drug use: No    Review of Systems  Constitutional: No fever/chills Eyes: No visual changes.  ENT: No sore throat. Cardiovascular: As above Respiratory: Denies shortness of breath. Gastrointestinal: No abdominal pain.  No nausea, no vomiting.   Genitourinary: Negative for dysuria. Musculoskeletal: Negative for back pain. Skin: Negative for rash. Neurological: Negative for headaches or weakness   ____________________________________________   PHYSICAL EXAM:  VITAL SIGNS: ED Triage Vitals [10/20/17 0827]  Enc Vitals Group     BP 139/75     Pulse Rate 63     Resp 20     Temp 98.5 F (36.9 C)     Temp Source Oral     SpO2 100 %     Weight 95.3 kg (210 lb)     Height 1.651 m ( )     Head Circumference      Peak Flow      Pain Score 9     Pain Loc      Pain Edu?      Excl. in GC?     Constitutional: Alert and oriented. No acute distress. Pleasant and interactive Eyes: Conjunctivae are normal.   Nose: No congestion/rhinnorhea. Mouth/Throat: Mucous membranes are moist.   Neck:  Painless ROM Cardiovascular: Normal rate, regular rhythm.  Grossly normal heart sounds.  Good peripheral circulation.  Able to replicate discomfort by pressing on the sternum on the lateral borders bilaterally Respiratory: Normal respiratory effort.  No retractions.  Gastrointestinal: Soft and nontender. No distention.  No CVA tenderness.  Musculoskeletal: No lower extremity tenderness nor edema.  Warm and well perfused Neurologic:  Normal speech and language. No gross focal neurologic deficits are appreciated.  Skin:  Skin is warm, dry and intact. No rash noted. Psychiatric: Mood and affect are normal. Speech and behavior are normal.  ____________________________________________   LABS (all labs ordered are listed, but only abnormal results are displayed)  Labs Reviewed  BASIC METABOLIC PANEL - Abnormal; Notable for the following components:      Result Value   Potassium 3.4 (*)    Calcium 8.7 (*)    All other components within normal limits  CBC  TROPONIN I   ____________________________________________  EKG  ED ECG REPORT I, Jene Every, the attending physician, personally viewed and interpreted this ECG.  Date: 10/20/2017  Rhythm: normal sinus rhythm QRS Axis: normal Intervals: normal ST/T Wave abnormalities: normal Narrative Interpretation: no evidence of acute ischemia  ____________________________________________  RADIOLOGY  Chest x-ray normal ____________________________________________   PROCEDURES  Procedure(s) performed: No  Procedures   Critical Care performed: No ____________________________________________   INITIAL IMPRESSION / ASSESSMENT AND PLAN / ED COURSE  Pertinent labs & imaging results that were available during my care of the patient were reviewed by me and considered in my medical decision making (see chart for details).  Patient well-appearing in no acute distress.  Lab work is reassuring, EKG is normal.  Chest x-ray is benign.  Exam is most consistent with chest wall tenderness, given  easily reproducible tenderness to the sternum.  Will treat with IM Toradol and reevaluate, anticipate discharge with outpatient follow-up   Patient had complete resolution of discomfort after IM Toradol.  Will treat with NSAIDs, outpatient follow-up with PCP.  Return precautions discussed    ____________________________________________   FINAL CLINICAL IMPRESSION(S) / ED DIAGNOSES  Final diagnoses:  Chest wall pain        Note:  This document was prepared using Dragon voice recognition software and may include unintentional dictation errors.    Jene Every, MD 10/20/17 1146

## 2017-10-20 NOTE — ED Triage Notes (Signed)
Pt reports CP intermittently for the past week and now it is constant. Pt reports pain is center of chest. Pt denies SOB.

## 2017-10-27 DIAGNOSIS — R0789 Other chest pain: Secondary | ICD-10-CM | POA: Insufficient documentation

## 2018-09-11 ENCOUNTER — Other Ambulatory Visit: Payer: Self-pay

## 2018-09-11 ENCOUNTER — Emergency Department
Admission: EM | Admit: 2018-09-11 | Discharge: 2018-09-11 | Disposition: A | Discharge status: Home / Self Care | Payer: Medicaid Other | Attending: Emergency Medicine | Admitting: Emergency Medicine

## 2018-09-11 DIAGNOSIS — Z87891 Personal history of nicotine dependence: Secondary | ICD-10-CM | POA: Insufficient documentation

## 2018-09-11 DIAGNOSIS — Y929 Unspecified place or not applicable: Secondary | ICD-10-CM | POA: Insufficient documentation

## 2018-09-11 DIAGNOSIS — Y999 Unspecified external cause status: Secondary | ICD-10-CM | POA: Insufficient documentation

## 2018-09-11 DIAGNOSIS — Z79899 Other long term (current) drug therapy: Secondary | ICD-10-CM | POA: Insufficient documentation

## 2018-09-11 DIAGNOSIS — Y939 Activity, unspecified: Secondary | ICD-10-CM | POA: Insufficient documentation

## 2018-09-11 DIAGNOSIS — S46812A Strain of other muscles, fascia and tendons at shoulder and upper arm level, left arm, initial encounter: Secondary | ICD-10-CM | POA: Insufficient documentation

## 2018-09-11 DIAGNOSIS — T148XXA Other injury of unspecified body region, initial encounter: Secondary | ICD-10-CM

## 2018-09-11 DIAGNOSIS — M25512 Pain in left shoulder: Secondary | ICD-10-CM

## 2018-09-11 DIAGNOSIS — X58XXXA Exposure to other specified factors, initial encounter: Secondary | ICD-10-CM | POA: Insufficient documentation

## 2018-09-11 MED ORDER — KETOROLAC TROMETHAMINE 30 MG/ML IJ SOLN
30.0000 mg | Freq: Once | INTRAMUSCULAR | Status: AC
  Administered 2018-09-11: 11:00:00 30 mg via INTRAMUSCULAR
  Filled 2018-09-11: qty 1

## 2018-09-11 MED ORDER — LIDOCAINE 5 % EX PTCH
1.0000 | MEDICATED_PATCH | CUTANEOUS | Status: DC
  Administered 2018-09-11: 1 via TRANSDERMAL
  Filled 2018-09-11: qty 1

## 2018-09-11 MED ORDER — KETOROLAC TROMETHAMINE 10 MG PO TABS: 10.0000 mg | ORAL_TABLET | Freq: Four times a day (QID) | ORAL | 0 refills | Status: DC | PRN

## 2018-09-11 MED ORDER — LIDOCAINE 5 % EX PTCH: 1.0000 | MEDICATED_PATCH | CUTANEOUS | 0 refills | Status: DC

## 2018-09-11 MED ORDER — CYCLOBENZAPRINE HCL 5 MG PO TABS: ORAL_TABLET | ORAL | 0 refills | Status: DC

## 2018-09-11 NOTE — ED Provider Notes (Signed)
[facility] Emergency Department Provider Note  ____________________________________________  Time seen: Approximately 10:06 AM  I have reviewed the triage vital signs and the nursing notes.   HISTORY  Chief Complaint Shoulder Pain    HPI Tammy Scott is a 43 y.o. female presents emergency department for evaluation of left upper back pain and shoulder pain for 2 weeks.  Patient states that area feels swollen.  Pain is worse when she moves her left arm and moves her neck.  Patient had a trauma back in 2016 and felt similar pain.  She has been home with the coated 19 quarantine and has been doing a lot of housework using that left arm and thinks this is what flared it up. She has taken Aleve, Tylenol and ibuprofen without relief.  Ice seems to help the pain and heat makes it worse.  She does not have primary care.  No recent trauma.  No recent illness.  No fever, shortness of breath, chest pain.   Past Medical History:  Diagnosis Date  . Abnormal pap   . Chlamydia   . History of gonorrhea     Patient Active Problem List   Diagnosis Date Noted  . Hematochezia 04/22/2013  . Urinary tract infection symptoms 04/22/2013    Past Surgical History:  Procedure Laterality Date  . COLONOSCOPY N/A 04/30/2013   Procedure: COLONOSCOPY;  Surgeon: West Bali, MD;  Location: [facility];  Service: Endoscopy;  Laterality: N/A;  1:15  . Colyposcopy    . WISDOM TOOTH EXTRACTION      Prior to Admission medications   Medication Sig Start Date End Date Taking? Authorizing Provider  acetaminophen (TYLENOL) 500 MG tablet Take 1,000 mg by mouth every 6 (six) hours as needed for moderate pain or headache.    [provider]  cholecalciferol (VITAMIN D) 1000 UNITS tablet Take 1,000 Units by mouth daily.    [provider]  cyclobenzaprine (FLEXERIL) 5 MG tablet Take 1-2 tablets 3 times daily as needed 09/11/18   Enid Derry, PA-C  fexofenadine  (ALLEGRA) 180 MG tablet Take 320 mg by mouth daily as needed for allergies or rhinitis.    [provider]  ketorolac (TORADOL) 10 MG tablet Take 1 tablet (10 mg total) by mouth every 6 (six) hours as needed. 09/11/18   Enid Derry, PA-C  levonorgestrel (MIRENA) 20 MCG/24HR IUD 1 each by Intrauterine route once.    [provider]  lidocaine (LIDODERM) 5 % Place 1 patch onto the skin daily. Remove & Discard patch within 12 hours or as directed by MD 09/11/18   Enid Derry, PA-C  Multiple Vitamin (MULTIVITAMIN) tablet Take 1 tablet by mouth daily.    [provider]    Allergies Patient has no known allergies.  Family History  Problem Relation Age of Onset  . Cancer Mother        breast  . Colon polyps Mother     Social History Social History   Tobacco Use  . Smoking status: Former Smoker    Packs/day: 1.00    Years: 3.00    Pack years: 3.00    Types: Cigarettes    Last attempt to quit: 06/10/2005    Years since quitting: 13.2  . Smokeless tobacco: Never Used  Substance Use Topics  . Alcohol use: Yes    Comment: socially  . Drug use: No     Review of Systems  Constitutional: No fever/chills ENT: No upper respiratory complaints. Cardiovascular: No  chest pain. Respiratory: No SOB. Gastrointestinal: No abdominal pain.  No nausea, no vomiting.  Musculoskeletal: Positive for back and shoulder pain. Skin: Negative for rash, abrasions, lacerations, ecchymosis. Neurological: Negative for headaches, numbness or tingling   ____________________________________________   PHYSICAL EXAM:  VITAL SIGNS: ED Triage Vitals  Enc Vitals Group     BP 09/11/18 1000 (!) 159/92     Pulse Rate 09/11/18 1000 76     Resp --      Temp 09/11/18 1000 98.2 F (36.8 C)     Temp Source 09/11/18 1000 Oral     SpO2 09/11/18 1000 100 %     Weight 09/11/18 1001 201 lb (91.2 kg)     Height 09/11/18 1001  (1.651 m)     Head Circumference --      Peak Flow --       Pain Score 09/11/18 1001 10     Pain Loc --      Pain Edu? --      Excl. in GC? --      Constitutional: Alert and oriented. Well appearing and in no acute distress. Eyes: Conjunctivae are normal. PERRL. EOMI. Head: Atraumatic. ENT:      Ears:      Nose: No congestion/rhinnorhea.      Mouth/Throat: Mucous membranes are moist.  Neck: No stridor. No cervical spine tenderness to palpation. Cardiovascular: Normal rate, regular rhythm.  Good peripheral circulation. Respiratory: Normal respiratory effort without tachypnea or retractions. Lungs CTAB. Good air entry to the bases with no decreased or absent breath sounds. Gastrointestinal: Bowel sounds 4 quadrants. Soft and nontender to palpation. No guarding or rigidity. No palpable masses. No distention.  Musculoskeletal: Full range of motion to all extremities. No gross deformities appreciated.  Pain elicited with left rotation of neck and with resisted flexion and extension of left shoulder.  Tenderness to palpation to left posterior shoulder and superior thoracic spine.  Full range of motion of left shoulder and neck.  Strength equal in upper extremities bilaterally. Neurologic:  Normal speech and language. No gross focal neurologic deficits are appreciated.  Skin:  Skin is warm, dry and intact. No rash noted. Psychiatric: Mood and affect are normal. Speech and behavior are normal. Patient exhibits appropriate insight and judgement.   ____________________________________________   LABS (all labs ordered are listed, but only abnormal results are displayed)  Labs Reviewed - No data to display ____________________________________________  EKG   ____________________________________________  RADIOLOGY   No results found.  ____________________________________________    PROCEDURES  Procedure(s) performed:    Procedures    Medications  ketorolac (TORADOL) 30 MG/ML injection 30 mg (30 mg Intramuscular Given 09/11/18  1033)     ____________________________________________   INITIAL IMPRESSION / ASSESSMENT AND PLAN / ED COURSE  Pertinent labs & imaging results that were available during my care of the patient were reviewed by me and considered in my medical decision making (see chart for details).  Review of the [place] CSRS was performed in accordance of the [facility] prior to dispensing any controlled drugs.   Patient's diagnosis is consistent with muscle strain.  Vital signs and exam are reassuring.  Patient will be discharged home with prescriptions for Toradol, Flexeril, Lidoderm. Patient is to follow up with primary care as directed. Patient is given ED precautions to return to the ED for any worsening or new symptoms.     ____________________________________________  FINAL CLINICAL IMPRESSION(S) / ED DIAGNOSES  Final diagnoses:  Acute pain of left shoulder  Muscle strain      NEW MEDICATIONS STARTED DURING THIS VISIT:  ED Discharge Orders         Ordered    cyclobenzaprine (FLEXERIL) 5 MG tablet     09/11/18 1056    ketorolac (TORADOL) 10 MG tablet  Every 6 hours PRN     09/11/18 1056    lidocaine (LIDODERM) 5 %  Every 24 hours     09/11/18 1056              This chart was dictated using voice recognition software/Dragon. Despite best efforts to proofread, errors can occur which can change the meaning. Any change was purely unintentional.    Enid Derry, PA-C 09/11/18 1408    Don Perking, [place], MD 09/11/18 [phone]

## 2018-09-11 NOTE — ED Notes (Signed)
See triage note  Presents with pain to left posterior shoulder  States she had an injury in 2016 and thinks she just aggravated it  Denies any new or recent injury  No deformity noted  States she has used ice with min relief

## 2018-09-11 NOTE — ED Triage Notes (Signed)
Pt reports a shoulder injury in 2016, states for the past week she has been having pain without recent injury, pt has some swelling noted to the left chest area and shoulder.pt has tried hot and cold packs, tylenol and alleve without relief

## 2018-09-14 MED FILL — medroxyPROGESTERone ACETATE: 150 | 90 days supply | Qty: 1 | Fill #0

## 2018-09-23 ENCOUNTER — Other Ambulatory Visit: Payer: Self-pay | Admitting: Family Medicine

## 2018-09-23 ENCOUNTER — Ambulatory Visit
Admission: RE | Admit: 2018-09-23 | Discharge: 2018-09-23 | Disposition: A | Discharge status: Home / Self Care | Payer: Medicaid Other | Source: Ambulatory Visit | Attending: Family Medicine | Admitting: Family Medicine

## 2018-09-23 ENCOUNTER — Other Ambulatory Visit: Payer: Self-pay

## 2018-09-23 DIAGNOSIS — M792 Neuralgia and neuritis, unspecified: Secondary | ICD-10-CM

## 2018-09-27 ENCOUNTER — Other Ambulatory Visit: Payer: Self-pay

## 2018-09-27 ENCOUNTER — Emergency Department: Payer: Medicaid Other

## 2018-09-27 ENCOUNTER — Emergency Department
Admission: EM | Admit: 2018-09-27 | Discharge: 2018-09-27 | Disposition: A | Discharge status: Home / Self Care | Payer: Medicaid Other | Attending: Emergency Medicine | Admitting: Emergency Medicine

## 2018-09-27 ENCOUNTER — Encounter: Payer: Self-pay | Admitting: Emergency Medicine

## 2018-09-27 DIAGNOSIS — Z87891 Personal history of nicotine dependence: Secondary | ICD-10-CM | POA: Insufficient documentation

## 2018-09-27 DIAGNOSIS — M5412 Radiculopathy, cervical region: Secondary | ICD-10-CM

## 2018-09-27 LAB — BASIC METABOLIC PANEL
Anion gap: 10 (ref 5–15)
BUN: 12 mg/dL (ref 6–20)
CO2: 24 mmol/L (ref 22–32)
Calcium: 9 mg/dL (ref 8.9–10.3)
Chloride: 105 mmol/L (ref 98–111)
Creatinine, Ser: 0.6 mg/dL (ref 0.44–1.00)
GFR calc Af Amer: >60 mL/min (ref >60)
GFR calc non Af Amer: >60 mL/min (ref >60)
Glucose, Bld: 122 mg/dL — ABNORMAL HIGH (ref 70–99)
Potassium: 3.4 mmol/L — ABNORMAL LOW (ref 3.5–5.1)
Sodium: 139 mmol/L (ref 135–145)

## 2018-09-27 LAB — CBC
HCT: 42.5 % (ref 36.0–46.0)
Hemoglobin: 13.7 g/dL (ref 12.0–15.0)
MCH: 29.4 pg (ref 26.0–34.0)
MCHC: 32.2 g/dL (ref 30.0–36.0)
MCV: 91.2 fL (ref 80.0–100.0)
Platelets: 309 10*3/uL (ref 150–400)
RBC: 4.66 MIL/uL (ref 3.87–5.11)
RDW: 11.9 % (ref 11.5–15.5)
WBC: 4.7 10*3/uL (ref 4.0–10.5)
nRBC: 0 % (ref 0.0–0.2)

## 2018-09-27 LAB — TROPONIN I: Troponin I: <0.03 ng/mL (ref <0.03)

## 2018-09-27 MED ORDER — PREDNISONE 10 MG (21) PO TBPK: ORAL_TABLET | ORAL | 0 refills | Status: DC

## 2018-09-27 MED ORDER — OXYCODONE-ACETAMINOPHEN 5-325 MG PO TABS: 1.0000 | ORAL_TABLET | Freq: Three times a day (TID) | ORAL | 0 refills | Status: DC | PRN

## 2018-09-27 MED ORDER — PREDNISONE 20 MG PO TABS
60.0000 mg | ORAL_TABLET | Freq: Once | ORAL | Status: AC
  Administered 2018-09-27: 60 mg via ORAL
  Filled 2018-09-27: qty 3

## 2018-09-27 MED ORDER — OXYCODONE-ACETAMINOPHEN 5-325 MG PO TABS
2.0000 | ORAL_TABLET | Freq: Once | ORAL | Status: AC
  Administered 2018-09-27: 16:00:00 2 via ORAL
  Filled 2018-09-27: qty 2

## 2018-09-27 NOTE — ED Notes (Signed)
First Nurse Note: Pt to ED via POV c/o pain in left arm. Pt is in NAD

## 2018-09-27 NOTE — ED Provider Notes (Signed)
[facility] Emergency Department Provider Note       Time seen: ----------------------------------------- 3:29 PM on 09/27/2018 -----------------------------------------   I have reviewed the triage vital signs and the nursing notes.  HISTORY   Chief Complaint Arm Pain    HPI Tammy Scott is a 43 y.o. female with a history of hematochezia who presents to the ED for left arm pain.  Patient describes radicular pain from her left thumb to her neck.  She describes it as an electric type pain.  Pain is not worse with any movement of the neck or arm.  She saw her primary care doctor and had an x-ray was given gabapentin and prednisone.  She took 1 dose of prednisone but this did not help her symptoms.  Past Medical History:  Diagnosis Date  . Abnormal pap   . Chlamydia   . History of gonorrhea     Patient Active Problem List   Diagnosis Date Noted  . Hematochezia 04/22/2013  . Urinary tract infection symptoms 04/22/2013    Past Surgical History:  Procedure Laterality Date  . COLONOSCOPY N/A 04/30/2013   Procedure: COLONOSCOPY;  Surgeon: West Bali, MD;  Location: [facility];  Service: Endoscopy;  Laterality: N/A;  1:15  . Colyposcopy    . WISDOM TOOTH EXTRACTION      Allergies Patient has no known allergies.  Social History Social History   Tobacco Use  . Smoking status: Former Smoker    Packs/day: 1.00    Years: 3.00    Pack years: 3.00    Types: Cigarettes    Last attempt to quit: 06/10/2005    Years since quitting: 13.3  . Smokeless tobacco: Never Used  Substance Use Topics  . Alcohol use: Yes    Comment: socially  . Drug use: No   Review of Systems Constitutional: Negative for fever. Cardiovascular: Negative for chest pain. Respiratory: Negative for shortness of breath. Gastrointestinal: Negative for abdominal pain, vomiting and diarrhea. Musculoskeletal: Positive for radicular left arm pain Skin: Negative for  rash. Neurological: Negative for headaches, focal weakness or numbness.  All systems negative/normal/unremarkable except as stated in the HPI  ____________________________________________   PHYSICAL EXAM:  VITAL SIGNS: ED Triage Vitals  Enc Vitals Group     BP 09/27/18 1407 (!) 156/108     Pulse Rate 09/27/18 1407 (!) 107     Resp 09/27/18 1407 20     Temp 09/27/18 1407 98.4 F (36.9 C)     Temp Source 09/27/18 1407 Oral     SpO2 09/27/18 1407 100 %     Weight 09/27/18 1408 201 lb (91.2 kg)     Height 09/27/18 1408 5\' 5"  (1.651 m)     Head Circumference --      Peak Flow --      Pain Score 09/27/18 1408 10     Pain Loc --      Pain Edu? --      Excl. in GC? --    Constitutional: Alert and oriented. Well appearing and in no distress. Eyes: Conjunctivae are normal. Normal extraocular movements. ENT      Head: Normocephalic and atraumatic.      Nose: No congestion/rhinnorhea.      Mouth/Throat: Mucous membranes are moist.      Neck: No stridor. Cardiovascular: Normal rate, regular rhythm. No murmurs, rubs, or gallops. Respiratory: Normal respiratory effort without tachypnea nor retractions. Breath sounds are clear and equal bilaterally. No wheezes/rales/rhonchi. Gastrointestinal: Soft and  nontender. Normal bowel sounds Musculoskeletal: Nontender with normal range of motion in extremities. No lower extremity tenderness nor edema. Neurologic:  Normal speech and language. No gross focal neurologic deficits are appreciated.  C6 radiculopathy in the left upper extremity, no motor deficits are noted Skin:  Skin is warm, dry and intact. No rash noted. Psychiatric: Mood and affect are normal. Speech and behavior are normal.  ____________________________________________  EKG: Interpreted by me.  Sinus rhythm the rate of 97 bpm, normal PR interval, normal QRS size, normal QT  ____________________________________________  ED COURSE:  As part of my medical decision making, I  reviewed the following data within the electronic MEDICAL RECORD NUMBER History obtained from family if available, nursing notes, old chart and ekg, as well as notes from prior ED visits. Patient presented for radicular left arm pain, we will assess with labs and imaging as indicated at this time.   Procedures  Tammy Scott was evaluated in Emergency Department on 09/27/2018 for the symptoms described in the history of present illness. She was evaluated in the context of the global COVID-19 pandemic, which necessitated consideration that the patient might be at risk for infection with the SARS-CoV-2 virus that causes COVID-19. Institutional protocols and algorithms that pertain to the evaluation of patients at risk for COVID-19 are in a state of rapid change based on information released by regulatory bodies including the CDC and federal and state organizations. These policies and algorithms were followed during the patient's care in the ED.  ____________________________________________   LABS (pertinent positives/negatives)  Labs Reviewed  BASIC METABOLIC PANEL - Abnormal; Notable for the following components:      Result Value   Potassium 3.4 (*)    Glucose, Bld 122 (*)    All other components within normal limits  CBC  TROPONIN I  POC URINE PREG, ED    RADIOLOGY  Chest x-ray is unremarkable  ____________________________________________   DIFFERENTIAL DIAGNOSIS   Cervical radiculopathy, spasm, strain, arthritis  FINAL ASSESSMENT AND PLAN  Cervical radiculopathy   Plan: The patient had presented for C6 distribution cervical radiculopathy in the left arm. Patient's labs were reassuring. Patient's imaging was unremarkable today.  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  I will place her on a longer steroid taper and pain medicine.  She will be referred to neurosurgery for follow-up and MRI.   Ulice DashJohnathan E Williams,  MD    Note: This note was generated in part or whole with voice recognition software. Voice recognition is usually quite accurate but there are transcription errors that can and very often do occur. I apologize for any typographical errors that were not detected and corrected.     Emily FilbertWilliams, Jonathan E, MD 09/27/18 [phone]

## 2018-09-27 NOTE — ED Triage Notes (Signed)
Pt here for left arm pain. Has "electric" pain from hand up to neck. Pain not worse when moving neck or arm.  Pain constant even when sitting.  Pt saw PCP and had xray and was given gabapentin and prednisone without relief.  Pt has had multiple family members die from MI.  No SHOB. Is on birth control, is smoker. Feels like arm is hot sometimes.

## 2018-10-30 ENCOUNTER — Other Ambulatory Visit: Payer: Self-pay | Admitting: Student

## 2018-10-30 DIAGNOSIS — M5412 Radiculopathy, cervical region: Secondary | ICD-10-CM

## 2018-11-14 ENCOUNTER — Ambulatory Visit
Admission: RE | Admit: 2018-11-14 | Discharge: 2018-11-14 | Disposition: A | Discharge status: Home / Self Care | Payer: Self-pay | Source: Ambulatory Visit | Attending: Student | Admitting: Student

## 2018-11-14 ENCOUNTER — Other Ambulatory Visit: Payer: Self-pay

## 2018-11-14 DIAGNOSIS — M5412 Radiculopathy, cervical region: Secondary | ICD-10-CM | POA: Insufficient documentation

## 2018-12-07 MED FILL — medroxyPROGESTERone ACETATE: 150 | 90 days supply | Qty: 1 | Fill #1

## 2019-02-26 MED FILL — medroxyPROGESTERone ACETATE: 150 | 90 days supply | Qty: 1 | Fill #2

## 2019-05-09 IMAGING — CR CHEST - 2 VIEW
1 series · 2 of 2 positions shown · non-contrast
Comparison: October 20, 2016

CLINICAL DATA: Arm pain.

EXAM:
CHEST - 2 VIEW

[Series 1: dg chest 2 view · 0.14mm/px · 2 of 2 slices shown]
[im 1/2]
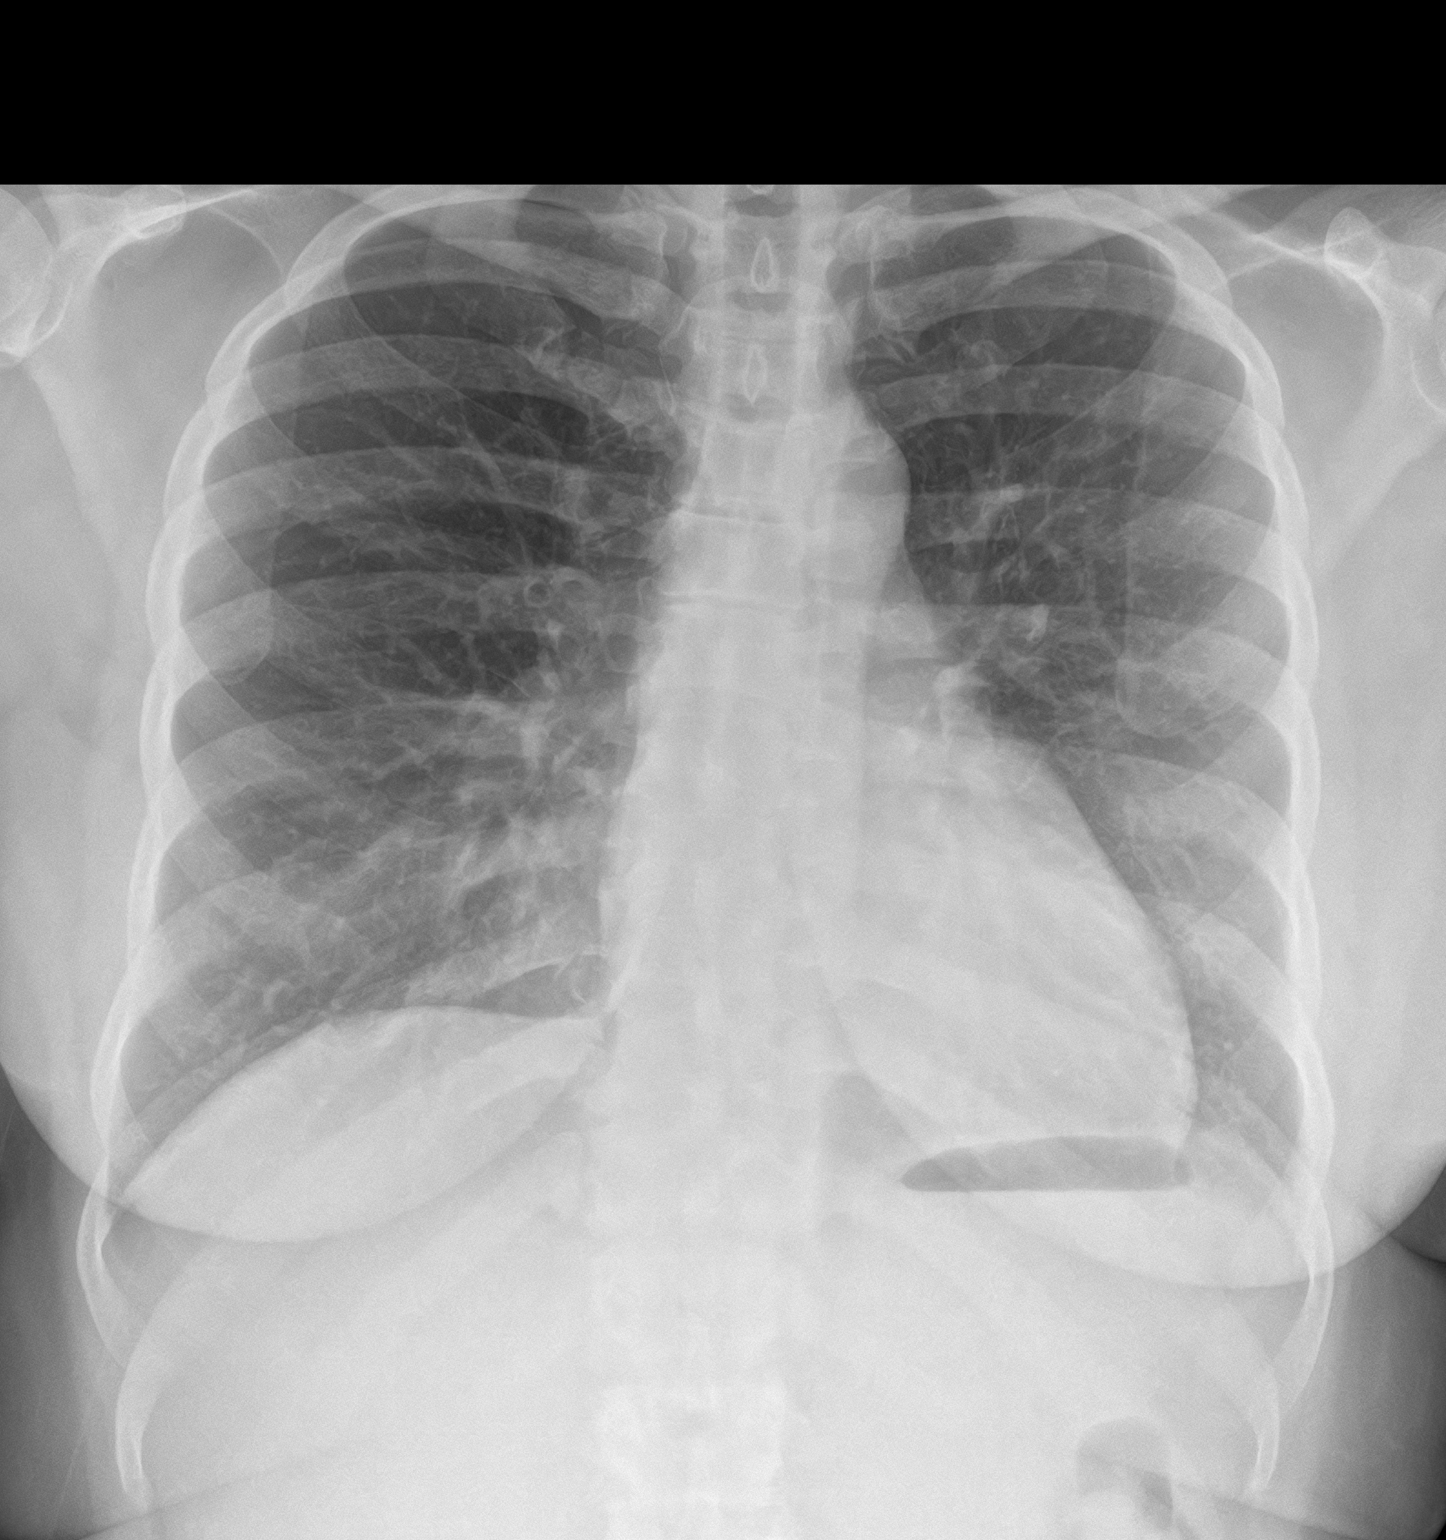
[im 2/2]
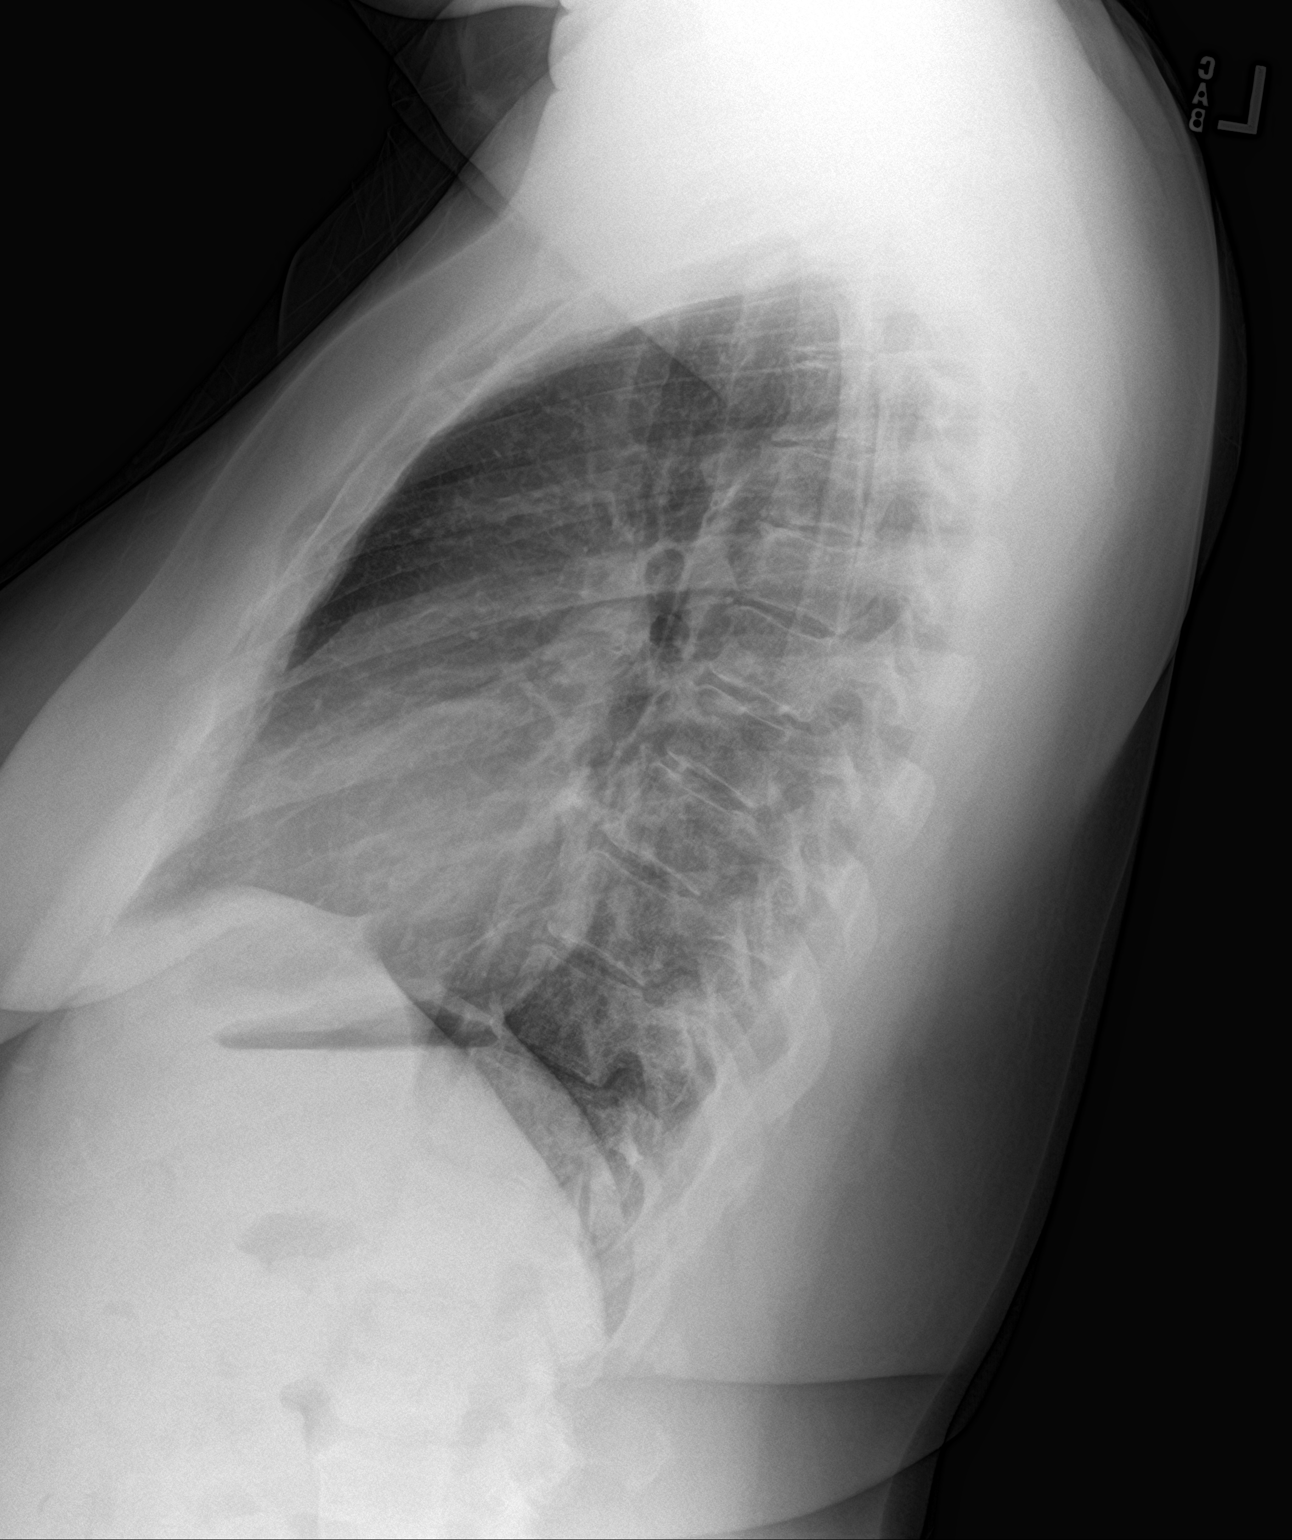

[2 of 2 positions shown; findings below may reference images not displayed]

FINDINGS: The heart, hila, mediastinum, lungs, and pleura are unremarkable.
IMPRESSION: No active cardiopulmonary disease.

## 2019-06-02 ENCOUNTER — Encounter: Payer: Self-pay | Admitting: Emergency Medicine

## 2019-06-02 ENCOUNTER — Emergency Department: Payer: Self-pay

## 2019-06-02 ENCOUNTER — Emergency Department
Admission: EM | Admit: 2019-06-02 | Discharge: 2019-06-02 | Disposition: A | Discharge status: Home / Self Care | Payer: Self-pay | Attending: Emergency Medicine | Admitting: Emergency Medicine

## 2019-06-02 ENCOUNTER — Other Ambulatory Visit: Payer: Self-pay

## 2019-06-02 DIAGNOSIS — T189XXA Foreign body of alimentary tract, part unspecified, initial encounter: Secondary | ICD-10-CM | POA: Insufficient documentation

## 2019-06-02 DIAGNOSIS — Z87891 Personal history of nicotine dependence: Secondary | ICD-10-CM | POA: Insufficient documentation

## 2019-06-02 DIAGNOSIS — Y929 Unspecified place or not applicable: Secondary | ICD-10-CM | POA: Insufficient documentation

## 2019-06-02 DIAGNOSIS — Y999 Unspecified external cause status: Secondary | ICD-10-CM | POA: Insufficient documentation

## 2019-06-02 DIAGNOSIS — Y9389 Activity, other specified: Secondary | ICD-10-CM | POA: Insufficient documentation

## 2019-06-02 DIAGNOSIS — X58XXXA Exposure to other specified factors, initial encounter: Secondary | ICD-10-CM | POA: Insufficient documentation

## 2019-06-02 NOTE — ED Provider Notes (Signed)
[facility] Emergency Department Provider Note  ____________________________________________   First MD Initiated Contact with Patient 06/02/19 [phone]     (approximate)  I have reviewed the triage vital signs and the nursing notes.   HISTORY  Chief Complaint Swallowed Foreign Body   HPI Tammy Scott is a 43 y.o. female presents to the ED with complaint of sore throat and foreign body sensation since last evening.  Patient states that she was eating a salad from [facility] and her plastic fork broke.  She states that she immediately vomited but still feels that there is something in her throat when she swallows.  She denies any difficulty breathing.  Patient states she has had a bowel movement since this occurrence.  Currently she rates her pain as a 0/10.       Past Medical History:  Diagnosis Date  . Abnormal pap   . Chlamydia   . History of gonorrhea     Patient Active Problem List   Diagnosis Date Noted  . Hematochezia 04/22/2013  . Urinary tract infection symptoms 04/22/2013    Past Surgical History:  Procedure Laterality Date  . COLONOSCOPY N/A 04/30/2013   Procedure: COLONOSCOPY;  Surgeon: Danie Binder, MD;  Location: [facility];  Service: Endoscopy;  Laterality: N/A;  1:15  . Colyposcopy    . WISDOM TOOTH EXTRACTION      Prior to Admission medications   Medication Sig Start Date End Date Taking? Authorizing Provider  acetaminophen (TYLENOL) 500 MG tablet Take 1,000 mg by mouth every 6 (six) hours as needed for moderate pain or headache.    [provider]  cholecalciferol (VITAMIN D) 1000 UNITS tablet Take 1,000 Units by mouth daily.    [provider]  fexofenadine (ALLEGRA) 180 MG tablet Take 320 mg by mouth daily as needed for allergies or rhinitis.    [provider]  levonorgestrel (MIRENA) 20 MCG/24HR IUD 1 each by Intrauterine route once.    [provider]  Multiple Vitamin (MULTIVITAMIN)  tablet Take 1 tablet by mouth daily.    [provider]    Allergies Patient has no known allergies.  Family History  Problem Relation Age of Onset  . Cancer Mother        breast  . Colon polyps Mother     Social History Social History   Tobacco Use  . Smoking status: Former Smoker    Packs/day: 1.00    Years: 3.00    Pack years: 3.00    Types: Cigarettes    Quit date: 06/10/2005    Years since quitting: 13.9  . Smokeless tobacco: Never Used  Substance Use Topics  . Alcohol use: Yes    Comment: socially  . Drug use: No    Review of Systems Constitutional: No fever/chills Eyes: No visual changes. ENT: Positive sore throat.  Positive foreign body sensation. Cardiovascular: Denies chest pain. Respiratory: Denies shortness of breath. Gastrointestinal: No abdominal pain.  No nausea, induced once vomiting. Musculoskeletal: Negative for back pain. Skin: Negative for rash. Neurological: Negative for headaches, focal weakness or numbness. ___________________________________________   PHYSICAL EXAM:  VITAL SIGNS: ED Triage Vitals  Enc Vitals Group     BP 06/02/19 0756 (!) 170/103     Pulse Rate 06/02/19 0756 96     Resp 06/02/19 0756 20     Temp 06/02/19 0755 98.4 F (36.9 C)     Temp Source 06/02/19 0755 Oral     SpO2 06/02/19 0756  100 %     Weight 06/02/19 0756 210 lb (95.3 kg)     Height 06/02/19 0756 5\' 6"  (1.676 m)     Head Circumference --      Peak Flow --      Pain Score 06/02/19 0756 0     Pain Loc --      Pain Edu? --      Excl. in GC? --     Constitutional: Alert and oriented. Well appearing and in no acute distress.  Patient is able to talk without any difficulty. Eyes: Conjunctivae are normal.  Head: Atraumatic. Nose: No congestion/rhinnorhea. Mouth/Throat: Mucous membranes are moist.  Oropharynx non-erythematous.  No evidence of trauma. Neck: No stridor.   Cardiovascular: Normal rate, regular rhythm. Grossly normal heart sounds.  Good  peripheral circulation. Respiratory: Normal respiratory effort.  No retractions. Lungs CTAB. Gastrointestinal: Soft and nontender. No distention.  Musculoskeletal: Moves upper and lower extremities without any difficulty.  Normal gait was noted. Neurologic:  Normal speech and language. No gross focal neurologic deficits are appreciated. No gait instability. Skin:  Skin is warm, dry and intact. No rash noted. Psychiatric: Mood and affect are normal. Speech and behavior are normal.  ____________________________________________   LABS (all labs ordered are listed, but only abnormal results are displayed)  Labs Reviewed - No data to display  RADIOLOGY  Official radiology report(s): DG Neck Soft Tissue  Result Date: 06/02/2019 CLINICAL DATA:  Question swallowed foreign body EXAM: NECK SOFT TISSUES - 1+ VIEW COMPARISON:  None. FINDINGS: There is no evidence of retropharyngeal soft tissue swelling or epiglottic enlargement. The cervical airway is unremarkable and no radio-opaque foreign body identified. IMPRESSION: Negative. Electronically Signed   By: 06/04/2019 M.D.   On: 06/02/2019 08:46    ____________________________________________   PROCEDURES  Procedure(s) performed (including Critical Care):  Procedures   ____________________________________________   INITIAL IMPRESSION / ASSESSMENT AND PLAN / ED COURSE  As part of my medical decision making, I reviewed the following data within the electronic MEDICAL RECORD NUMBER Notes from prior ED visits and [place] Controlled Substance Database   43 year old female presents to the ED with complaint of questionable swallowed foreign body last evening while she was eating a salad.  Patient states that one of the tines on her fork broke.  Patient has continued to talk and swallow saliva without any difficulty.  X-rays were negative for metallic foreign body.  Patient was made aware if she continues to have problems she may need to see a  gastroenterologist for a upper endoscopy.  Patient is to continue with increased fluids.  She is aware she can return to the emergency department over the weekend if any severe worsening of her symptoms. ____________________________________________   FINAL CLINICAL IMPRESSION(S) / ED DIAGNOSES  Final diagnoses:  Foreign body, swallowed, initial encounter     ED Discharge Orders    None       Note:  This document was prepared using Dragon voice recognition software and may include unintentional dictation errors.    48, PA-C 06/02/19 1001    06/04/19, MD 06/02/19 1210

## 2019-06-02 NOTE — ED Triage Notes (Signed)
Pt reports last night was eating a salad with a plastic fork and thinks she swallowed one of the prongs. Pt reports she immediately vomited but still feels something in her throat when she swallows. Denies difficulty breathing.

## 2019-06-02 NOTE — ED Notes (Signed)
See triage note  States she swallowed a piece of a plastic fork last pm  States she feels like she can't swallow

## 2019-06-02 NOTE — Discharge Instructions (Addendum)
Follow-up with your primary care provider if any continued problems.  Also the gastroenterologist is listed on your discharge papers and you should call make an appointment if the sensation continues.  Drink lots of fluids and eat foods that are high in fiber.  If there is a part of the fork it should pass in your stools in 24 to 48 hours.  Return to the emergency department over the holiday weekend if any severe worsening of your symptoms.

## 2019-10-24 ENCOUNTER — Emergency Department: Payer: Self-pay

## 2019-10-24 ENCOUNTER — Encounter: Payer: Self-pay | Admitting: Radiology

## 2019-10-24 ENCOUNTER — Emergency Department
Admission: EM | Admit: 2019-10-24 | Discharge: 2019-10-24 | Disposition: A | Discharge status: Home / Self Care | Payer: Self-pay | Attending: Emergency Medicine | Admitting: Emergency Medicine

## 2019-10-24 DIAGNOSIS — Z79899 Other long term (current) drug therapy: Secondary | ICD-10-CM | POA: Insufficient documentation

## 2019-10-24 DIAGNOSIS — N939 Abnormal uterine and vaginal bleeding, unspecified: Secondary | ICD-10-CM | POA: Insufficient documentation

## 2019-10-24 DIAGNOSIS — N760 Acute vaginitis: Secondary | ICD-10-CM | POA: Insufficient documentation

## 2019-10-24 DIAGNOSIS — Z87891 Personal history of nicotine dependence: Secondary | ICD-10-CM | POA: Insufficient documentation

## 2019-10-24 DIAGNOSIS — R109 Unspecified abdominal pain: Secondary | ICD-10-CM

## 2019-10-24 LAB — WET PREP, GENITAL
Sperm: NONE SEEN
Trich, Wet Prep: NONE SEEN
Yeast Wet Prep HPF POC: NONE SEEN

## 2019-10-24 LAB — CBC WITH DIFFERENTIAL/PLATELET
Abs Immature Granulocytes: 0.01 10*3/uL (ref 0.00–0.07)
Basophils Absolute: 0 10*3/uL (ref 0.0–0.1)
Basophils Relative: 1 %
Eosinophils Absolute: 0 10*3/uL (ref 0.0–0.5)
Eosinophils Relative: 1 %
HCT: 41.7 % (ref 36.0–46.0)
Hemoglobin: 13.4 g/dL (ref 12.0–15.0)
Immature Granulocytes: 0 %
Lymphocytes Relative: 40 %
Lymphs Abs: 1.6 10*3/uL (ref 0.7–4.0)
MCH: 29.5 pg (ref 26.0–34.0)
MCHC: 32.1 g/dL (ref 30.0–36.0)
MCV: 91.9 fL (ref 80.0–100.0)
Monocytes Absolute: 0.4 10*3/uL (ref 0.1–1.0)
Monocytes Relative: 9 %
Neutro Abs: 1.9 10*3/uL (ref 1.7–7.7)
Neutrophils Relative %: 49 %
Platelets: 305 10*3/uL (ref 150–400)
RBC: 4.54 MIL/uL (ref 3.87–5.11)
RDW: 12.3 % (ref 11.5–15.5)
WBC: 4 10*3/uL (ref 4.0–10.5)
nRBC: 0 % (ref 0.0–0.2)

## 2019-10-24 LAB — URINALYSIS, COMPLETE (UACMP) WITH MICROSCOPIC
Bilirubin Urine: NEGATIVE
Glucose, UA: NEGATIVE mg/dL
Ketones, ur: NEGATIVE mg/dL
Leukocytes,Ua: NEGATIVE
Nitrite: NEGATIVE
Protein, ur: NEGATIVE mg/dL
Specific Gravity, Urine: 1.02 (ref 1.005–1.030)
pH: 6 (ref 5.0–8.0)

## 2019-10-24 LAB — CHLAMYDIA/NGC RT PCR (ARMC ONLY)
Chlamydia Tr: NOT DETECTED
N gonorrhoeae: NOT DETECTED

## 2019-10-24 LAB — COMPREHENSIVE METABOLIC PANEL
ALT: 20 U/L (ref 0–44)
AST: 20 U/L (ref 15–41)
Albumin: 4.3 g/dL (ref 3.5–5.0)
Alkaline Phosphatase: 48 U/L (ref 38–126)
Anion gap: 8 (ref 5–15)
BUN: 10 mg/dL (ref 6–20)
CO2: 25 mmol/L (ref 22–32)
Calcium: 9.5 mg/dL (ref 8.9–10.3)
Chloride: 106 mmol/L (ref 98–111)
Creatinine, Ser: 0.75 mg/dL (ref 0.44–1.00)
GFR calc Af Amer: >60 mL/min (ref >60)
GFR calc non Af Amer: >60 mL/min (ref >60)
Glucose, Bld: 95 mg/dL (ref 70–99)
Potassium: 4 mmol/L (ref 3.5–5.1)
Sodium: 139 mmol/L (ref 135–145)
Total Bilirubin: 0.8 mg/dL (ref 0.3–1.2)
Total Protein: 7.9 g/dL (ref 6.5–8.1)

## 2019-10-24 LAB — POCT PREGNANCY, URINE: Preg Test, Ur: NEGATIVE

## 2019-10-24 LAB — LIPASE, BLOOD: Lipase: 150 U/L — ABNORMAL HIGH (ref 11–51)

## 2019-10-24 MED ORDER — KETOROLAC TROMETHAMINE 30 MG/ML IJ SOLN
30.0000 mg | Freq: Once | INTRAMUSCULAR | Status: AC
  Administered 2019-10-24: 30 mg via INTRAVENOUS
  Filled 2019-10-24: qty 1

## 2019-10-24 MED ORDER — LIDOCAINE 5 % EX PTCH
1.0000 | MEDICATED_PATCH | CUTANEOUS | Status: DC
  Administered 2019-10-24: 1 via TRANSDERMAL
  Filled 2019-10-24: qty 1

## 2019-10-24 MED ORDER — METRONIDAZOLE 500 MG PO TABS
500.0000 mg | ORAL_TABLET | Freq: Two times a day (BID) | ORAL | 0 refills | Status: AC
Start: 2019-10-24 — End: 2019-10-31

## 2019-10-24 MED ORDER — IOHEXOL 300 MG/ML  SOLN
100.0000 mL | Freq: Once | INTRAMUSCULAR | Status: AC | PRN
  Administered 2019-10-24: 100 mL via INTRAVENOUS

## 2019-10-24 NOTE — ED Provider Notes (Signed)
[facility] Emergency Department Provider Note  ____________________________________________   First MD Initiated Contact with Patient 10/24/19 1102     (approximate)  I have reviewed the triage vital signs and the nursing notes.   HISTORY  Chief Complaint Flank Pain and Abdominal Pain    HPI Tammy Scott is a 44 y.o. female who is otherwise healthy who comes in with right flank pain.  Patient states that she been having the pain for 2 weeks but it seems to be getting worse.  She describes it as a pressure sensation, intermittent, worse with movements, has not been taking anything over-the-counter to help with the pain.  The pain occasionally radiates into the right lower abdomen and around umbilicus. no nausea vomiting fevers.  States that she thinks she is having some blood in her urine as well.  Took a sample to her OB/GYN who stated that she did not have any signs of UTI.  Patient is on the Depo shot.  She states that she has noticed a foul smell to her vagina but not sure that she is from some of the bleeding.          Past Medical History:  Diagnosis Date  . Abnormal pap   . Chlamydia   . History of gonorrhea     Patient Active Problem List   Diagnosis Date Noted  . Hematochezia 04/22/2013  . Urinary tract infection symptoms 04/22/2013    Past Surgical History:  Procedure Laterality Date  . COLONOSCOPY N/A 04/30/2013   Procedure: COLONOSCOPY;  Surgeon: West Bali, MD;  Location: [facility];  Service: Endoscopy;  Laterality: N/A;  1:15  . Colyposcopy    . WISDOM TOOTH EXTRACTION      Prior to Admission medications   Medication Sig Start Date End Date Taking? Authorizing Provider  acetaminophen (TYLENOL) 500 MG tablet Take 1,000 mg by mouth every 6 (six) hours as needed for moderate pain or headache.    [provider]  cholecalciferol (VITAMIN D) 1000 UNITS tablet Take 1,000 Units by mouth daily.    [provider]  fexofenadine (ALLEGRA) 180 MG tablet Take 320 mg by mouth daily as needed for allergies or rhinitis.    [provider]  levonorgestrel (MIRENA) 20 MCG/24HR IUD 1 each by Intrauterine route once.    [provider]  Multiple Vitamin (MULTIVITAMIN) tablet Take 1 tablet by mouth daily.    [provider]    Allergies Patient has no known allergies.  Family History  Problem Relation Age of Onset  . Cancer Mother        breast  . Colon polyps Mother     Social History Social History   Tobacco Use  . Smoking status: Former Smoker    Packs/day: 1.00    Years: 3.00    Pack years: 3.00    Types: Cigarettes    Quit date: 06/10/2005    Years since quitting: 14.3  . Smokeless tobacco: Never Used  Substance Use Topics  . Alcohol use: Yes    Comment: socially  . Drug use: No      Review of Systems Constitutional: No fever/chills Eyes: No visual changes. ENT: No sore throat. Cardiovascular: Denies chest pain. Respiratory: Denies shortness of breath. Gastrointestinal: Abdominal pain no nausea, no vomiting.  No diarrhea.  No constipation. Genitourinary: Negative for dysuria.  Hematuria, foul smell Musculoskeletal: Negative for back pain.  Flank pain Skin: Negative for rash. Neurological: Negative for headaches,  focal weakness or numbness. All other ROS negative ____________________________________________   PHYSICAL EXAM:  VITAL SIGNS: ED Triage Vitals [10/24/19 1054]  Enc Vitals Group     BP (!) 139/92     Pulse Rate 69     Resp 18     Temp 98.9 F (37.2 C)     Temp Source Oral     SpO2 100 %     Weight      Height      Head Circumference      Peak Flow      Pain Score 8     Pain Loc      Pain Edu?      Excl. in GC?     Constitutional: Alert and oriented. Well appearing and in no acute distress. Eyes: Conjunctivae are normal. EOMI. Head: Atraumatic. Nose: No congestion/rhinnorhea. Mouth/Throat: Mucous membranes are  moist.   Neck: No stridor. Trachea Midline. FROM Cardiovascular: Normal rate, regular rhythm. Grossly normal heart sounds.  Good peripheral circulation. Respiratory: Normal respiratory effort.  No retractions. Lungs CTAB. Gastrointestinal: Soft but some right mid abdomen tenderness and some tenderness around the umbilicus.  No rebound, no guarding.  No distention. No abdominal bruits.  Some right flank tenderness as well Musculoskeletal: No lower extremity tenderness nor edema.  No joint effusions. Neurologic:  Normal speech and language. No gross focal neurologic deficits are appreciated.  Skin:  Skin is warm, dry and intact. No rash noted. Psychiatric: Mood and affect are normal. Speech and behavior are normal. GU: Mild amount of blood in the vault with normal cervix without any erythema.  No cervical motion tenderness or adnexal tenderness  ____________________________________________   LABS (all labs ordered are listed, but only abnormal results are displayed)  Labs Reviewed  WET PREP, GENITAL - Abnormal; Notable for the following components:      Result Value   Clue Cells Wet Prep HPF POC PRESENT (*)    WBC, Wet Prep HPF POC MODERATE (*)    All other components within normal limits  LIPASE, BLOOD - Abnormal; Notable for the following components:   Lipase 150 (*)    All other components within normal limits  URINALYSIS, COMPLETE (UACMP) WITH MICROSCOPIC - Abnormal; Notable for the following components:   Color, Urine YELLOW (*)    APPearance HAZY (*)    Hgb urine dipstick SMALL (*)    Bacteria, UA RARE (*)    All other components within normal limits  CHLAMYDIA/NGC RT PCR ([facility] ONLY)  CBC WITH DIFFERENTIAL/PLATELET  COMPREHENSIVE METABOLIC PANEL  POC URINE PREG, ED  POCT PREGNANCY, URINE   ____________________________________________ _  RADIOLOGY   Official radiology report(s): No results found. IMPRESSION: 1. No acute abdominopelvic findings. Normal  appendix. 2. No evidence of obstructive uropathy. 3. Numerous rounded and lobulated cystic lesions scattered throughout both lobes of the liver, the largest within the posterior right hepatic lobe measuring up to 4.0 cm with simple fluid internal density. Additional subcentimeter lesions are too small to definitively characterize. Findings likely represent additional cysts, hemangiomas, and/or biliary hamartomas. A hepatic protocol MRI of the abdomen could be performed for more definitive characterization on an outpatient basis. ____________________________________________   PROCEDURES  Procedure(s) performed (including Critical Care):  Procedures   ____________________________________________   INITIAL IMPRESSION / ASSESSMENT AND PLAN / ED COURSE  Tammy Scott was evaluated in Emergency Department on 10/24/2019 for the symptoms described in the history of present illness. She was evaluated in the context of the global COVID-19  pandemic, which necessitated consideration that the patient might be at risk for infection with the SARS-CoV-2 virus that causes COVID-19. Institutional protocols and algorithms that pertain to the evaluation of patients at risk for COVID-19 are in a state of rapid change based on information released by regulatory bodies including the CDC and federal and state organizations. These policies and algorithms were followed during the patient's care in the ED.    Patient is a 44 year old who comes in with right flank pain and concern for hematuria as well as little bit of right mid abdomen pain.  Discussed with patient getting a CT scan with contrast so we can evaluate for the possibility of kidney stone versus appendicitis versus diverticulitis.  Lower suspicion for ovarian torsion given pain is not pelvic in nature and given the description of the pain.  Patient okay with proceeding with CT imaging. Will get labs evaluate for cholecystitis but again her pain is  not right upper quadrant.   Patient's labs are reassuring.  CT scan with normal appendix and no signs of kidney stone.  She does have some incidental cystic lesions in the liver which I have discussed with patient that she will need outpatient MRI for further characterization.  Patient is pelvic exam shows vaginal bleeding.  Discussed with patient that I think that her "hematuria" was most likely bleeding from the vagina.  I discussed that I have very low suspicion for ovarian torsion at this time given the normal ovaries on CT scan and the description of her pain.  Patient offered transvaginal ultrasound but she is want to hold off at this time.  Patient is on the Depo shot.  Discussed with patient that if she continues to have the abnormal vaginal bleeding that she will need to follow-up with OB/GYN for discussion with them.  UA without evidence of UTI.  There was some clue cells so we will treat her for bacterial vaginosis.  She understands not to drink while on the Flagyl.  Discussed symptomatic treatment with Tylenol and ibuprofen.  I discussed the provisional nature of ED diagnosis, the treatment so far, the ongoing plan of care, follow up appointments and return precautions with the patient and any family or support people present. They expressed understanding and agreed with the plan, discharged home.  ____________________________________________   FINAL CLINICAL IMPRESSION(S) / ED DIAGNOSES   Final diagnoses:  Bacterial vaginosis  Vaginal bleeding  Right flank pain      MEDICATIONS GIVEN DURING THIS VISIT:  Medications  iohexol (OMNIPAQUE) 300 MG/ML solution 100 mL (100 mLs Intravenous Contrast Given 10/24/19 1227)  ketorolac (TORADOL) 30 MG/ML injection 30 mg (30 mg Intravenous Given 10/24/19 1413)     ED Discharge Orders         Ordered    metroNIDAZOLE (FLAGYL) 500 MG tablet  2 times daily     10/24/19 1517           Note:  This document was prepared using Dragon  voice recognition software and may include unintentional dictation errors.   Vanessa [place], MD 10/25/19 [phone]

## 2019-10-24 NOTE — ED Notes (Signed)
First Nurse Note: Pt to ED via POV c/o blood in urine and right flank pain. Pt is in NAD.

## 2019-10-24 NOTE — Discharge Instructions (Addendum)
You can take Tylenol 1 g every 8 hours and ibuprofen 400-600 every 6 hours with food for the next 1 week to help with any discomfort.  You should follow-up with OB/GYN to discuss transvaginal ultrasound for your irregular vaginal bleeding if it is continuing.  You may have a fibroid or endometriosis.  You also need follow-up with your primary care doctor for your abnormal CT scan below.  You had an elevated lipase level but no evidence of pancreatitis on your examination.  You had positive bacterial vaginosis which is not an STD but we are covering you in case that is causing the foul-smelling odor that you noticed.  Do not drink alcohol while on this medicine.  IMPRESSION:  1. No acute abdominopelvic findings. Normal appendix.  2. No evidence of obstructive uropathy.  3. Numerous rounded and lobulated cystic lesions scattered  throughout both lobes of the liver, the largest within the posterior  right hepatic lobe measuring up to 4.0 cm with simple fluid internal  density. Additional subcentimeter lesions are too small to  definitively characterize. Findings likely represent additional  cysts, hemangiomas, and/or biliary hamartomas. A hepatic protocol  MRI of the abdomen could be performed for more definitive  characterization on an outpatient basis.

## 2019-10-24 NOTE — ED Triage Notes (Signed)
Pt states that she has been having right sided flank pain for the past 2 weeks and reports pain right lower quad, pt states that her urine has odor to it and is seeing blood in the urine and when she wipes, denies hx of kidney stones and states she had a urinalysis ran by her OB last week who told her they saw blood as well

## 2020-04-24 ENCOUNTER — Other Ambulatory Visit (HOSPITAL_COMMUNITY): Payer: Self-pay | Admitting: Obstetrics and Gynecology

## 2020-06-04 IMAGING — CT CT ABD-PELV W/ CM
2 of 5 series · 16 of 46 positions shown, 18 images · IV contrast (APPLIED)
Comparison: None.

CLINICAL DATA: Right lower quadrant abdominal pain and right flank
pain for 2 weeks

EXAM:
CT ABDOMEN AND PELVIS WITH CONTRAST
TECHNIQUE: Multidetector CT imaging of the abdomen and pelvis was performed
using the standard protocol following bolus administration of
intravenous contrast.
CONTRAST:  100mL OMNIPAQUE IOHEXOL 300 MG/ML  SOLN

[Series 2: routine abd/pel with · axial · 0.89mm/px · z∈[-892,-462]mm · 13 of 98 slices shown, 15 images]
[im 6/98  soft-tissue]
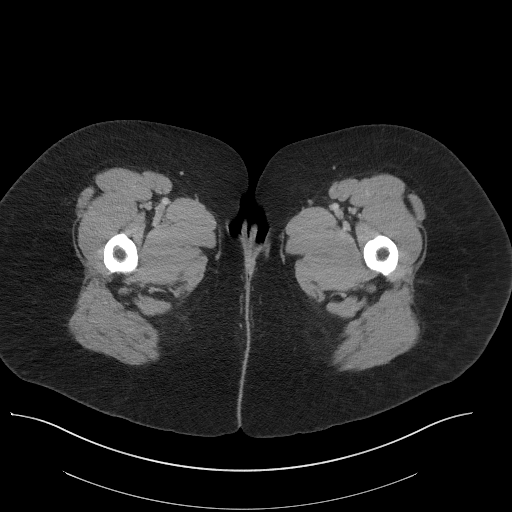
[im 6/98  bone]
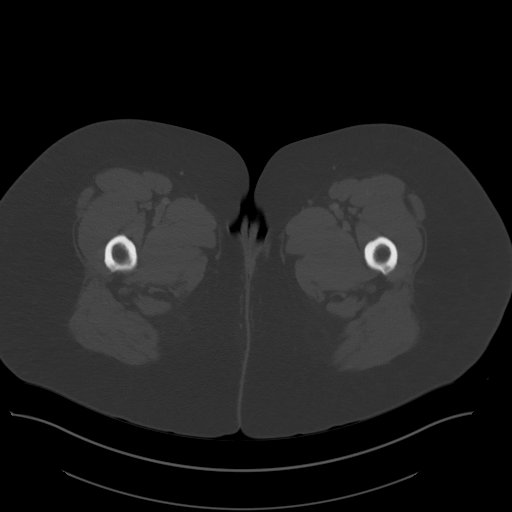
[im 11/98  soft-tissue]
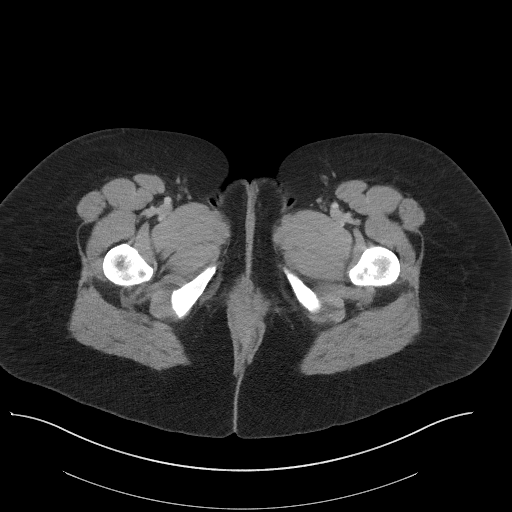
[im 22/98  soft-tissue]
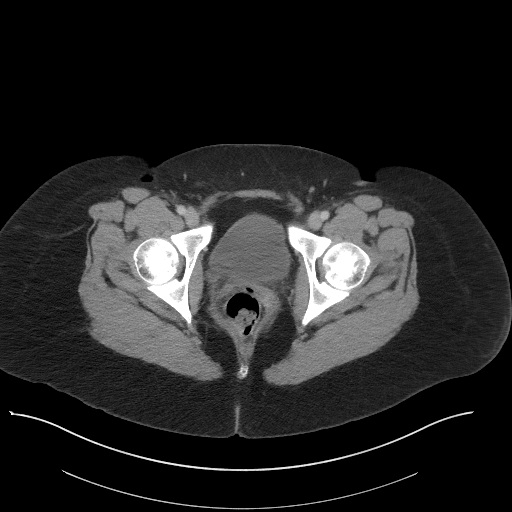
[im 27/98  soft-tissue]
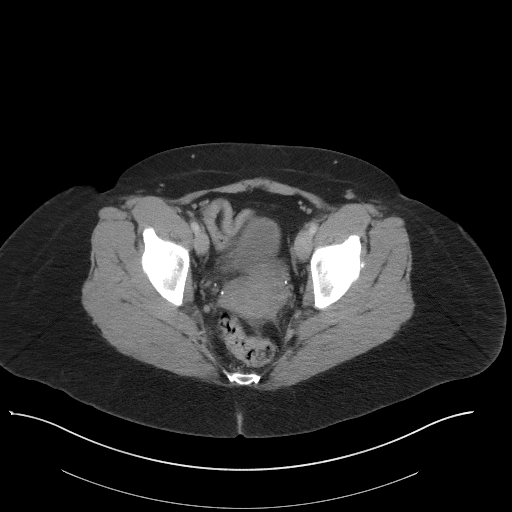
[im 33/98  soft-tissue]
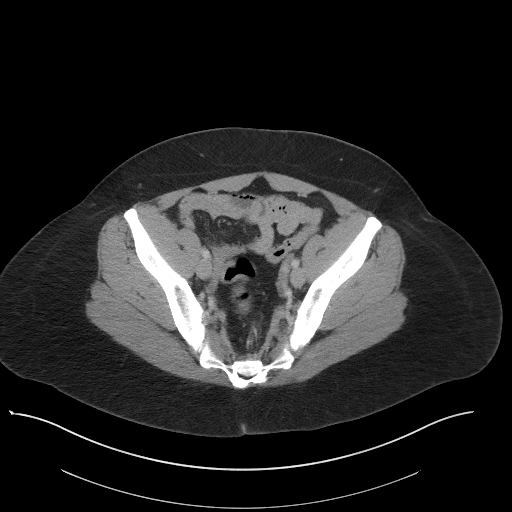
[im 44/98  soft-tissue]
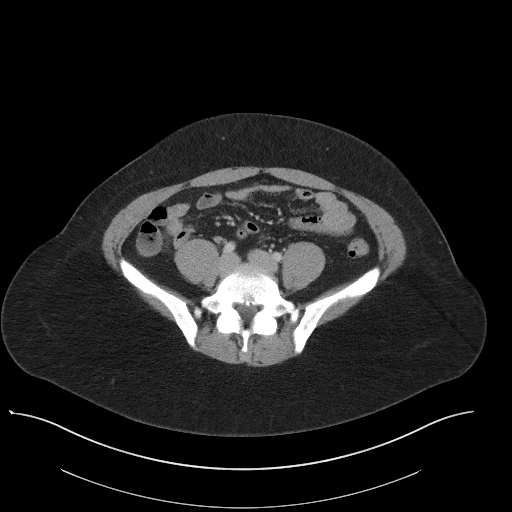
[im 49/98  soft-tissue]
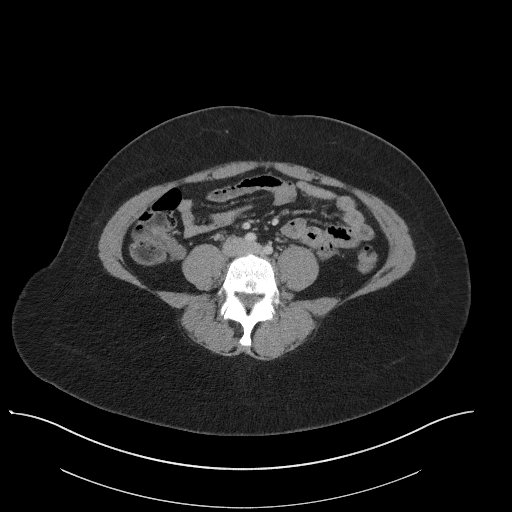
[im 54/98  soft-tissue]
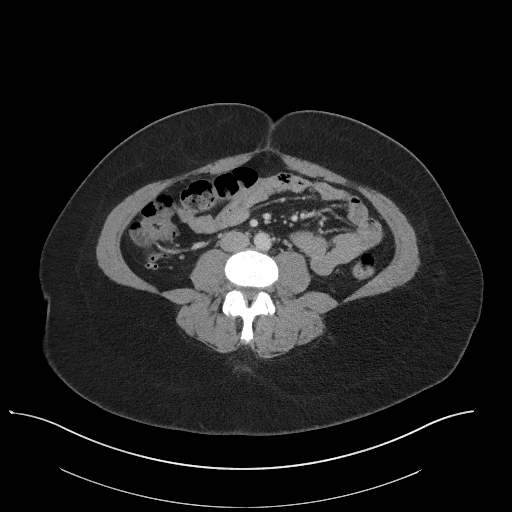
[im 65/98  soft-tissue]
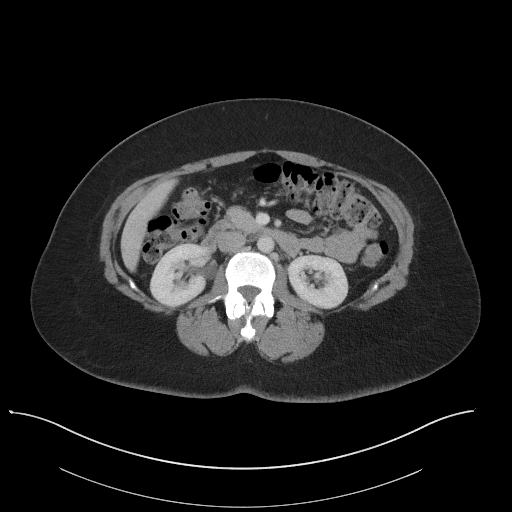
[im 65/98  bone]
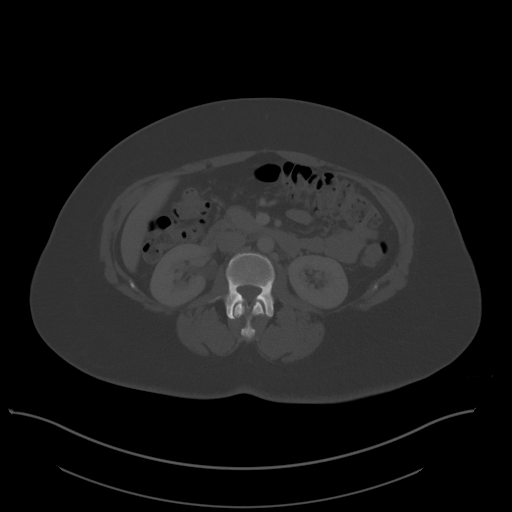
[im 71/98  soft-tissue]
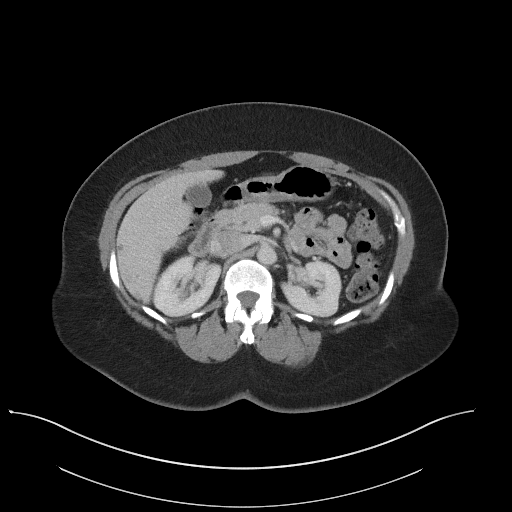
[im 76/98  soft-tissue]
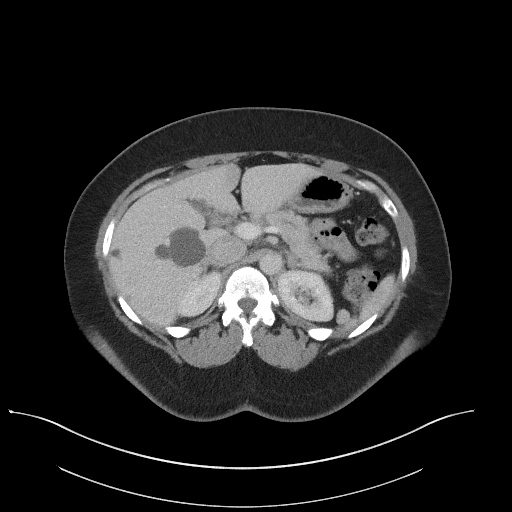
[im 87/98  soft-tissue]
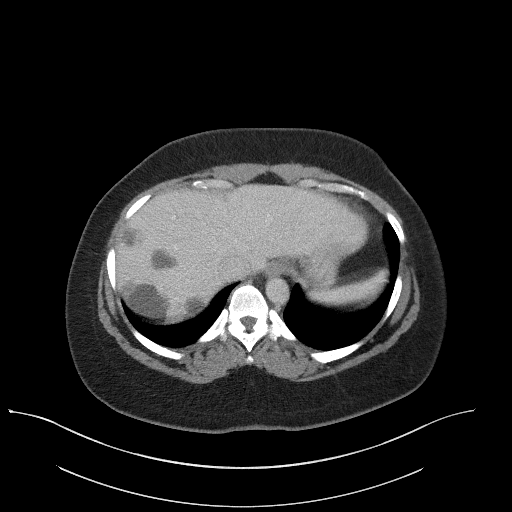
[im 92/98  soft-tissue]
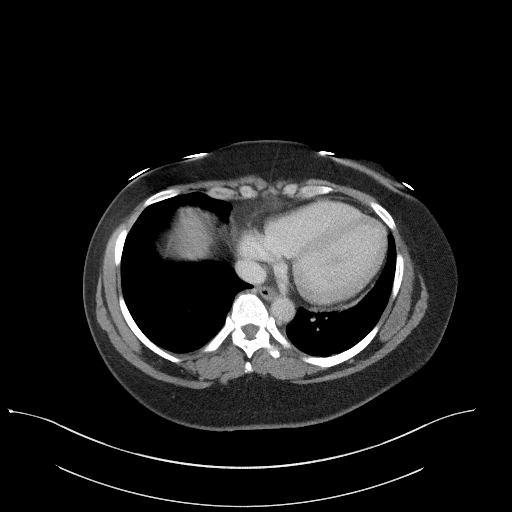

[Series 5: coronal st · coronal · 0.88mm/px · 3 of 96 slices shown]
[im 32/96  soft-tissue]
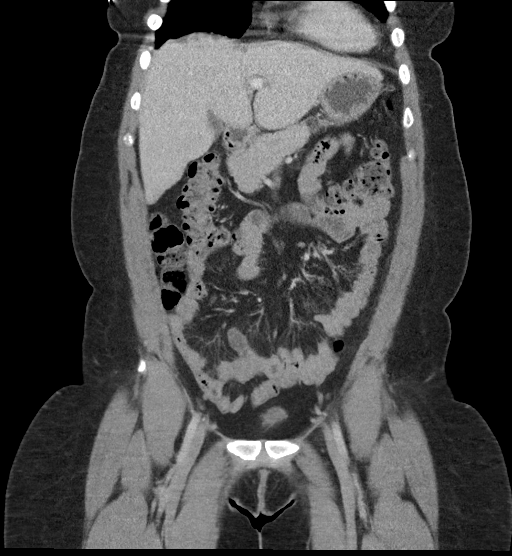
[im 43/96  soft-tissue]
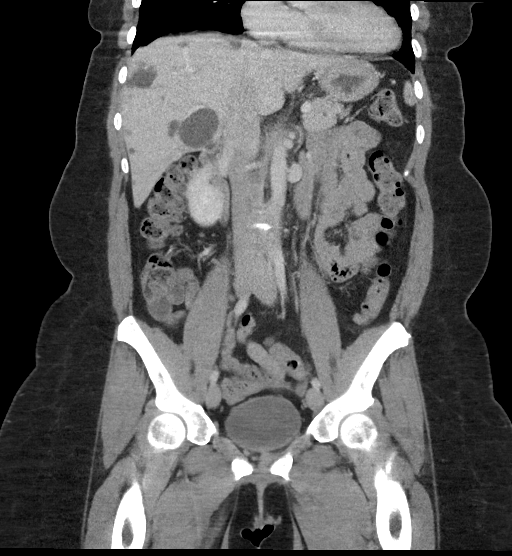
[im 53/96  soft-tissue]
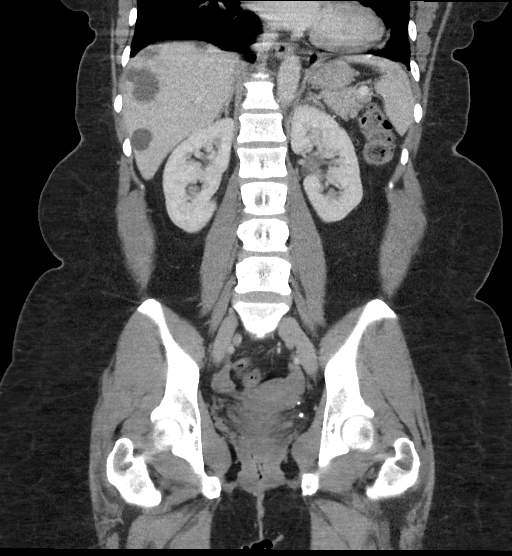

[16 of 46 positions shown; findings below may reference images not displayed]

FINDINGS: Lower chest: Visualized lung bases are clear.

Hepatobiliary: Numerous rounded and lobulated fluid density lesions
scattered throughout both lobes of the liver, the largest within the
posterior right hepatic lobe measuring 4.0 x 3.0 x 3.5 cm with
simple fluid internal density. Additional subcentimeter lesions are
too small to definitively characterize. Unremarkable gallbladder. No
hyperdense gallstone. No biliary dilatation.

Pancreas: Unremarkable. No pancreatic ductal dilatation or
surrounding inflammatory changes.

Spleen: Normal in size without focal abnormality.

Adrenals/Urinary Tract: Unremarkable adrenal glands. The kidneys
enhance symmetrically. No focal renal lesion, stone, or
hydronephrosis. Bilateral ureters are nondilated. No definite
ureteral calculi. There are multiple small lower pelvic
calcifications, likely phleboliths, closely approximating the
bilateral ureters without definite soft tissue rim sign to suggest
an intra-ureteral stone. Urinary bladder appears unremarkable.

Stomach/Bowel: Stomach is within normal limits. Appendix appears
normal (series 2, image 43). No evidence of bowel wall thickening,
distention, or inflammatory changes.

Vascular/Lymphatic: No significant vascular findings are present. No
enlarged abdominal or pelvic lymph nodes.

Reproductive: Uterus and bilateral adnexa are unremarkable.

Other: No abdominal wall hernia or abnormality. No abdominopelvic
ascites.

Musculoskeletal: No acute or significant osseous findings.
IMPRESSION: 1. No acute abdominopelvic findings. Normal appendix.
2. No evidence of obstructive uropathy.
3. Numerous rounded and lobulated cystic lesions scattered
throughout both lobes of the liver, the largest within the posterior
right hepatic lobe measuring up to 4.0 cm with simple fluid internal
density. Additional subcentimeter lesions are too small to
definitively characterize. Findings likely represent additional
cysts, hemangiomas, and/or biliary hamartomas. A hepatic protocol
MRI of the abdomen could be performed for more definitive
characterization on an outpatient basis.

## 2021-03-08 ENCOUNTER — Emergency Department (HOSPITAL_COMMUNITY): Payer: Medicaid Other

## 2021-03-08 ENCOUNTER — Emergency Department (HOSPITAL_COMMUNITY): Payer: 59

## 2021-03-08 ENCOUNTER — Ambulatory Visit (HOSPITAL_COMMUNITY)
Admission: EM | Admit: 2021-03-08 | Discharge: 2021-03-08 | Disposition: A | Discharge status: Home / Self Care | Payer: Self-pay | Attending: Physician Assistant | Admitting: Physician Assistant

## 2021-03-08 ENCOUNTER — Encounter (HOSPITAL_COMMUNITY): Payer: Self-pay

## 2021-03-08 ENCOUNTER — Other Ambulatory Visit: Payer: Self-pay

## 2021-03-08 ENCOUNTER — Encounter (HOSPITAL_COMMUNITY): Payer: Self-pay | Admitting: Emergency Medicine

## 2021-03-08 ENCOUNTER — Emergency Department (HOSPITAL_COMMUNITY)
Admission: EM | Admit: 2021-03-08 | Discharge: 2021-03-08 | Disposition: A | Discharge status: Home / Self Care | Payer: Medicaid Other | Attending: Emergency Medicine | Admitting: Emergency Medicine

## 2021-03-08 DIAGNOSIS — M79604 Pain in right leg: Secondary | ICD-10-CM | POA: Diagnosis not present

## 2021-03-08 DIAGNOSIS — M25561 Pain in right knee: Secondary | ICD-10-CM

## 2021-03-08 DIAGNOSIS — Z5321 Procedure and treatment not carried out due to patient leaving prior to being seen by health care provider: Secondary | ICD-10-CM | POA: Insufficient documentation

## 2021-03-08 DIAGNOSIS — S8991XA Unspecified injury of right lower leg, initial encounter: Secondary | ICD-10-CM

## 2021-03-08 DIAGNOSIS — S8991XD Unspecified injury of right lower leg, subsequent encounter: Secondary | ICD-10-CM

## 2021-03-08 DIAGNOSIS — M25562 Pain in left knee: Secondary | ICD-10-CM | POA: Insufficient documentation

## 2021-03-08 DIAGNOSIS — M7989 Other specified soft tissue disorders: Secondary | ICD-10-CM | POA: Diagnosis not present

## 2021-03-08 DIAGNOSIS — M79661 Pain in right lower leg: Secondary | ICD-10-CM | POA: Insufficient documentation

## 2021-03-08 MED ORDER — TIZANIDINE HCL 4 MG PO CAPS
4.0000 mg | ORAL_CAPSULE | Freq: Three times a day (TID) | ORAL | 0 refills | Status: DC | PRN
Start: 2021-03-08 — End: 2022-02-18

## 2021-03-08 NOTE — ED Notes (Signed)
Called pt x3 for room, no response. 

## 2021-03-08 NOTE — ED Provider Notes (Signed)
[facility]    CSN: [id number] Arrival date & time: 03/08/21  1448      History   Chief Complaint Chief Complaint  Patient presents with   Knee Pain    HPI Tammy Scott is a 45 y.o. female.   Patient presents today with a 3-week history of right knee/leg pain.  Reports that she injured herself by hitting her medial knee on a cabinet and developed a significant area of swelling.  She initially was treating this with ice, Tylenol, ibuprofen, heat, compression and had improvement in the size of the lesion but continues to have pain.  She reports that pain is significantly worsened since going on a car ride for 6 hours.  She denies personal history of VTE event but does report a history of blood clots in her mother.  She does not take any exogenous estrogen denies any recent hospitalization.  Denies any recent COVID-19 infection.  Denies any chest pain or shortness of breath.  Pain is rated 10 on a 0-10 pain scale, localized to right medial knee with radiation into leg, described as aching periodic shooting pains, worse with attempted ambulation or palpation, no alleviating factors identified.  She did go to the emergency room earlier today and was evaluated in triage at which point x-ray was obtained that showed no acute findings.  She has not had any additional imaging or been evaluated since that time.  She denies history of diabetes but is prediabetic; has taken steroids for musculoskeletal injury in the past that were ineffective.   Past Medical History:  Diagnosis Date   Abnormal pap    Chlamydia    History of gonorrhea     Patient Active Problem List   Diagnosis Date Noted   Hematochezia 04/22/2013   Urinary tract infection symptoms 04/22/2013    Past Surgical History:  Procedure Laterality Date   COLONOSCOPY N/A 04/30/2013   Procedure: COLONOSCOPY;  Surgeon: West Bali, MD;  Location: [facility];  Service: Endoscopy;  Laterality: N/A;  1:15    Colyposcopy     WISDOM TOOTH EXTRACTION      OB History     Gravida  3   Para  2   Term  2   Preterm      AB  1   Living  2      SAB  1   IAB      Ectopic      Multiple      Live Births               Home Medications    Prior to Admission medications   Medication Sig Start Date End Date Taking? Authorizing Provider  tiZANidine (ZANAFLEX) 4 MG capsule Take 1 capsule (4 mg total) by mouth 3 (three) times daily as needed for muscle spasms. 03/08/21  Yes Kaari Zeigler, Noberto Retort, PA-C  acetaminophen (TYLENOL) 500 MG tablet Take 1,000 mg by mouth every 6 (six) hours as needed for moderate pain or headache.    [provider]  cholecalciferol (VITAMIN D) 1000 UNITS tablet Take 1,000 Units by mouth daily.    [provider]  fexofenadine (ALLEGRA) 180 MG tablet Take 320 mg by mouth daily as needed for allergies or rhinitis.    [provider]  levonorgestrel (MIRENA) 20 MCG/24HR IUD 1 each by Intrauterine route once.    [provider]  medroxyPROGESTERone (DEPO-PROVERA) 150 MG/ML injection INJECT 1 ML INTRAMUSCULAR EVERY 3 MONTHS 04/24/20 04/24/21  Steva Ready, DO  Multiple Vitamin (MULTIVITAMIN) tablet Take 1 tablet by mouth daily.    [provider]    Family History Family History  Problem Relation Age of Onset   Cancer Mother        breast   Colon polyps Mother     Social History Social History   Tobacco Use   Smoking status: Former    Packs/day: 1.00    Years: 3.00    Pack years: 3.00    Types: Cigarettes    Quit date: 06/10/2005    Years since quitting: 15.7   Smokeless tobacco: Never  Substance Use Topics   Alcohol use: Yes    Comment: socially   Drug use: No     Allergies   Patient has no known allergies.   Review of Systems Review of Systems  Constitutional:  Positive for activity change. Negative for appetite change, fatigue and fever.  Respiratory:  Negative for cough and shortness of breath.    Cardiovascular:  Negative for chest pain.  Musculoskeletal:  Positive for arthralgias, gait problem and joint swelling. Negative for myalgias.  Neurological:  Negative for weakness.    Physical Exam Triage Vital Signs ED Triage Vitals  Enc Vitals Group     BP 03/08/21 1547 (!) 157/91     Pulse Rate 03/08/21 1547 68     Resp 03/08/21 1547 17     Temp --      Temp src --      SpO2 03/08/21 1547 100 %     Weight --      Height --      Head Circumference --      Peak Flow --      Pain Score 03/08/21 1545 10     Pain Loc --      Pain Edu? --      Excl. in GC? --    No data found.  Updated Vital Signs BP (!) 157/91 (BP Location: Right Arm)   Pulse 68   Resp 17   LMP  (LMP Unknown)   SpO2 100%   Visual Acuity Right Eye Distance:   Left Eye Distance:   Bilateral Distance:    Right Eye Near:   Left Eye Near:    Bilateral Near:     Physical Exam Vitals reviewed.  Constitutional:      General: She is awake. She is not in acute distress.    Appearance: Normal appearance. She is well-developed. She is not ill-appearing.     Comments: Very pleasant female appears stated age in no acute distress sitting comfortably in exam room  HENT:     Head: Normocephalic and atraumatic.  Cardiovascular:     Rate and Rhythm: Normal rate and regular rhythm.     Heart sounds: Normal heart sounds, S1 normal and S2 normal. No murmur heard. Pulmonary:     Effort: Pulmonary effort is normal.     Breath sounds: Normal breath sounds. No wheezing, rhonchi or rales.     Comments: Clear to auscultation bilaterally Musculoskeletal:     Right knee: Swelling present. No ecchymosis. Decreased range of motion. Tenderness present over the medial joint line. No LCL laxity, MCL laxity, ACL laxity or PCL laxity.     Right lower leg: Tenderness present. No bony tenderness. No edema.     Left lower leg: No edema.       Legs:     Comments: Tender nodule noted right medial knee.  Denies  palpation over  medial joint line.  No deformity noted.  No ligamentous laxity on exam.  No cords noted in popliteal fossa.  Negative Homans' sign.  Psychiatric:        Behavior: Behavior is cooperative.     UC Treatments / Results  Labs (all labs ordered are listed, but only abnormal results are displayed) Labs Reviewed - No data to display  EKG   Radiology DG Knee Complete 4 Views Right  Result Date: 03/08/2021 CLINICAL DATA:  Anterior knee pain.  Injury last week. EXAM: RIGHT KNEE - COMPLETE 4+ VIEW COMPARISON:  None. FINDINGS: No evidence of fracture, dislocation, or joint effusion. No evidence of arthropathy or other focal bone abnormality. Soft tissues are unremarkable. IMPRESSION: Negative. Electronically Signed   By: Charlett Nose M.D.   On: 03/08/2021 09:58   VAS [place] LOWER EXTREMITY VENOUS (DVT) (7a-7p)  Result Date: 03/08/2021  Lower Venous DVT Study Patient Name:  Tammy Scott  Date of Exam:   03/08/2021 Medical Rec #: [id number]          Accession #:    [id number] Date of Birth: 1975-09-12          Patient Gender: F Patient Age:   24 years Exam Location:  [facility] Procedure:      VAS [place] LOWER EXTREMITY VENOUS (DVT) Referring Phys: Army Melia --------------------------------------------------------------------------------  Indications: Swelling, and Pain.  Comparison Study: no prior Performing Technologist: [place] Ponder RVS  Examination Guidelines: A complete evaluation includes B-mode imaging, spectral Doppler, color Doppler, and power Doppler as needed of all accessible portions of each vessel. Bilateral testing is considered an integral part of a complete examination. Limited examinations for reoccurring indications may be performed as noted. The reflux portion of the exam is performed with the patient in reverse Trendelenburg.  +---------+---------------+---------+-----------+----------+--------------+ RIGHT    CompressibilityPhasicitySpontaneityPropertiesThrombus Aging  +---------+---------------+---------+-----------+----------+--------------+ CFV      Full           Yes      Yes                                 +---------+---------------+---------+-----------+----------+--------------+ SFJ      Full                                                        +---------+---------------+---------+-----------+----------+--------------+ FV Prox  Full                                                        +---------+---------------+---------+-----------+----------+--------------+ FV Mid   Full                                                        +---------+---------------+---------+-----------+----------+--------------+ FV DistalFull                                                        +---------+---------------+---------+-----------+----------+--------------+  PFV      Full                                                        +---------+---------------+---------+-----------+----------+--------------+ POP      Full           Yes      Yes                                 +---------+---------------+---------+-----------+----------+--------------+ PTV      Full                                                        +---------+---------------+---------+-----------+----------+--------------+ PERO     Full                                                        +---------+---------------+---------+-----------+----------+--------------+   +----+---------------+---------+-----------+----------+--------------+ LEFTCompressibilityPhasicitySpontaneityPropertiesThrombus Aging +----+---------------+---------+-----------+----------+--------------+ CFV Full           Yes      Yes                                 +----+---------------+---------+-----------+----------+--------------+     Summary: RIGHT: - There is no evidence of deep vein thrombosis in the lower extremity.  - No cystic structure found in the popliteal fossa.   LEFT: - No evidence of common femoral vein obstruction.  *See table(s) above for measurements and observations. Electronically signed by Gerarda Fraction on 03/08/2021 at 3:07:43 PM.    Final     Procedures Procedures (including critical care time)  Medications Ordered in UC Medications - No data to display  Initial Impression / Assessment and Plan / UC Course  I have reviewed the triage vital signs and the nursing notes.  Pertinent labs & imaging results that were available during my care of the patient were reviewed by me and considered in my medical decision making (see chart for details).      Discussed symptoms are likely related to hematoma from injury and will take time to resolve.  Offered prednisone but patient reports she has a concerns in the past that were ineffective.  She was encouraged to continue alternating over-the-counter analgesics for pain relief. No indication for repeat x-ray given this was normal in the emergency room earlier today.  Initially ordered ultrasound study to rule out DVT but then realized that this had already been performed as patient had not mentioned it during visit.  This imaging was negative so attempted to call patient that repeat was not necessary but unable to reach her so left a voicemail requesting return call.  Encouraged her to use warm compresses on affected area.  Discussed potential utility of brace, however, I suspect this would put pressure on area that is tender and would likely cause more pain than relief.  She was prescribed Zanaflex to help provide some relief  of symptoms.  Recommended that patient follow-up with sports medicine given ongoing symptoms and was given contact information for local provider.  Discussed that this is sedating and she should not drive or drink alcohol taking it.  Encouraged her to use conservative treatment measures as previously discussed to manage symptoms.  Discussed alarm symptoms that warrant emergent evaluation.   Strict return precautions given to which she expressed understanding.  Final Clinical Impressions(s) / UC Diagnoses   Final diagnoses:  Acute pain of right knee  Right leg pain  Injury of right lower extremity, subsequent encounter     Discharge Instructions      The x-ray obtained the hospital was normal.  We are going to get an ultrasound to make sure there is not a blood clot.  I have called in a muscle relaxer to help with your pain.  You can take this up to 3 times a day but will make you sleepy do not drive or drink alcohol with it.  Please continue with heat and over-the-counter medications such as Tylenol and ibuprofen for symptom relief.  If everything comes back normal you continue to have pain I do recommend following up with the sports medicine provider for further evaluation and management.  If you develop any worsening symptoms including increased pain, chest pain, shortness of breath you must go to the emergency room overnight as we discussed.     ED Prescriptions     Medication Sig Dispense Auth. Provider   tiZANidine (ZANAFLEX) 4 MG capsule Take 1 capsule (4 mg total) by mouth 3 (three) times daily as needed for muscle spasms. 21 capsule Sanjay Broadfoot K, PA-C      PDMP not reviewed this encounter.   Jeani Hawking, PA-C 03/08/21 1629

## 2021-03-08 NOTE — ED Provider Notes (Signed)
Emergency Medicine Provider Triage Evaluation Note  Tammy Scott , a 45 y.o. female  was evaluated in triage.  Pt complains of right knee pain and calf pain.  States that she had pain in her knee after hitting it on a cabinet, took a 6-hour drive to [place] and back on September 18 to participate in a walk.  Now has pain in the anterior right knee as well as her right calf.  No history of DVT.Marland Kitchen  Review of Systems  Positive: Knee pain, calf pain Negative: Shortness of breath  Physical Exam  BP 139/79   Pulse 66   Temp 98.8 F (37.1 C) (Oral)   Resp 16   SpO2 100%  Gen:   Awake, no distress   Resp:  Normal effort  MSK:   Moves extremities without difficulty  Other:  Tenderness palpation anterior right knee/tibia.  No palpable cords, swelling, erythema.  Does have mild tenderness with Homans  Medical Decision Making  Medically screening exam initiated at 9:02 AM.  Appropriate orders placed.  Tammy Scott was informed that the remainder of the evaluation will be completed by another provider, this initial triage assessment does not replace that evaluation, and the importance of remaining in the ED until their evaluation is complete.     Jeannie Fend, PA-C 03/08/21 2595    [name], MD 03/08/21 1314

## 2021-03-08 NOTE — ED Triage Notes (Signed)
Pt endorses left knee pain that worsened today. Hit her knee on a cabinet a few weeks ago. Also reports a 6 hr drive on 3/84.

## 2021-03-08 NOTE — ED Triage Notes (Signed)
Pt presents with knee pain.

## 2021-03-08 NOTE — Progress Notes (Signed)
Lower extremity venous has been completed.   Preliminary results in CV Proc.   Aundra Millet Ayce Pietrzyk 03/08/2021 11:34 AM

## 2021-03-08 NOTE — Discharge Instructions (Signed)
The x-ray obtained the hospital was normal.  We are going to get an ultrasound to make sure there is not a blood clot.  I have called in a muscle relaxer to help with your pain.  You can take this up to 3 times a day but will make you sleepy do not drive or drink alcohol with it.  Please continue with heat and over-the-counter medications such as Tylenol and ibuprofen for symptom relief.  If everything comes back normal you continue to have pain I do recommend following up with the sports medicine provider for further evaluation and management.  If you develop any worsening symptoms including increased pain, chest pain, shortness of breath you must go to the emergency room overnight as we discussed.

## 2021-03-09 ENCOUNTER — Ambulatory Visit (HOSPITAL_COMMUNITY)
Admission: RE | Admit: 2021-03-09 | Discharge: 2021-03-09 | Disposition: A | Discharge status: Home / Self Care | Payer: Self-pay | Source: Ambulatory Visit | Attending: Emergency Medicine | Admitting: Emergency Medicine

## 2021-03-09 ENCOUNTER — Other Ambulatory Visit (HOSPITAL_COMMUNITY): Payer: Self-pay | Admitting: Emergency Medicine

## 2021-03-09 DIAGNOSIS — M79604 Pain in right leg: Secondary | ICD-10-CM

## 2021-03-09 DIAGNOSIS — M7989 Other specified soft tissue disorders: Secondary | ICD-10-CM | POA: Insufficient documentation
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
## 2021-03-14 ENCOUNTER — Ambulatory Visit: Payer: 59 | Admitting: Family Medicine

## 2021-03-14 VITALS — Ht 65.0 in | Wt 235.0 lb

## 2021-03-14 DIAGNOSIS — M25561 Pain in right knee: Secondary | ICD-10-CM

## 2021-03-14 NOTE — Patient Instructions (Signed)
Thank you for coming to see me today. It was a pleasure. Today we talked about:   You can use voltaren gel as needed for your pain.  You can get this over the counter at the pharmacy.  We will send you to physical therapy as well.   Please follow-up with [place] in 6 weeks.  If you have any questions or concerns, please do not hesitate to call the office at [phone].  Best,   Luis Abed, DO [facility]

## 2021-03-14 NOTE — Assessment & Plan Note (Signed)
Discussed with patient that her pain could be related to arthritis versus a degenerative or traumatic meniscus tear, it is possible that she has an old meniscus tear from her prior injury.  Discussed treatment options including oral anti-inflammatory, corticosteroid injection, physical therapy.  She opts to perform physical therapy only and would not like any medications at this time.  Did advise that she can use Voltaren gel as needed in the area.  We will have her follow-up in 6 weeks to ensure that she is having improvement.  Would consider weight bearing films at that time if not having improvement.

## 2021-03-29 ENCOUNTER — Other Ambulatory Visit: Payer: Self-pay

## 2021-03-29 ENCOUNTER — Ambulatory Visit: Payer: 59 | Attending: Family Medicine

## 2021-03-29 DIAGNOSIS — M25561 Pain in right knee: Secondary | ICD-10-CM

## 2021-03-29 DIAGNOSIS — R262 Difficulty in walking, not elsewhere classified: Secondary | ICD-10-CM

## 2021-03-29 DIAGNOSIS — M6281 Muscle weakness (generalized): Secondary | ICD-10-CM

## 2021-03-29 DIAGNOSIS — M25661 Stiffness of right knee, not elsewhere classified: Secondary | ICD-10-CM | POA: Diagnosis present

## 2021-03-29 NOTE — Therapy (Addendum)
[place], [place], [address] Phone: [phone]   Fax:  [phone]  Physical Therapy Evaluation/Discharge  Patient Details  Name: Tammy Scott MRN: [id number] Date of Birth: 09-21-1975 Referring Provider (PT): Dene Gentry, MD   Encounter Date: 03/29/2021   PT End of Session - 03/29/21 1654     Visit Number 1    Number of Visits 7    Date for PT Re-Evaluation 05/12/21    Authorization Type [employer]    PT Start Time 1615    PT Stop Time 1655    PT Time Calculation (min) 40 min    Activity Tolerance Patient tolerated treatment well    Behavior During Therapy [facility] for tasks assessed/performed             Past Medical History:  Diagnosis Date   Abnormal pap    Chlamydia    History of gonorrhea     Past Surgical History:  Procedure Laterality Date   COLONOSCOPY N/A 04/30/2013   Procedure: COLONOSCOPY;  Surgeon: Danie Binder, MD;  [facility];  Service: Endoscopy;  Laterality: N/A;  1:15   Colyposcopy     WISDOM TOOTH EXTRACTION      There were no vitals filed for this visit.    Subjective Assessment - 03/29/21 1617     Subjective Patient reports end of August she hit the front of the Rt knee against a cabinet at home and did RICE to treat it, which did help with her pain. On 02/25/21 she rode in a car for 6 hours and completed a 3.1 mile walk and then rode back in a car for 6 hours. She had a little pain following the walk, but the next morning her knee was swollen and she coule barely move her knee. She went to urgent care later that week and was prescribed a  muscle relaxer. Her X-rays were unremarkable. She was referred to sports medicine and was presribed voltaren, which she has helped significantly with her pain.    Limitations Standing;Walking;Sitting    How long can you sit comfortably? about 1 hour    How long can you stand comfortably? about 1 hour     How long can you walk comfortably? unknown    Diagnostic tests X-ray IMPRESSION:  Negative.    Patient Stated Goals "to see what's going on with my knee and keep my joints active to prevent this from happening again."    Currently in Pain? Yes    Pain Score 1     Pain Location Knee    Pain Orientation Right;Anterior    Pain Descriptors / Indicators Nagging    Pain Type Other (Comment)   subacute   Pain Onset More than a month ago    Pain Frequency Intermittent    Aggravating Factors  prolonged standing, prolonged sitting    Pain Relieving Factors changing position, voltaren                [facility] PT Assessment - 03/29/21 0001       Assessment   Medical Diagnosis M25.561 (ICD-10-CM) - Acute pain of right knee    Referring Provider (PT) Dene Gentry, MD    Onset Date/Surgical Date 02/25/21    Hand Dominance Right    Next MD Visit 04/25/21    Prior Therapy yes- shoulder      Precautions   Precautions None      Restrictions  Weight Bearing Restrictions No      Balance Screen   Has the patient fallen in the past 6 months No      [place] residence    Living Arrangements Alone    Type of Alpha    Additional Comments no stairs      Prior Function   Level of Independence Independent    Vocation Full time employment    Runner, broadcasting/film/video    Leisure travel      Cognition   Overall Cognitive Status Within Functional Limits for tasks assessed      Observation/Other Assessments   Focus on Therapeutic Outcomes (FOTO)  80% function to 80% predicted      Sensation   Light Touch Not tested      Coordination   Gross Motor Movements are Fluid and Coordinated Yes      Posture/Postural Control   Posture/Postural Control No significant limitations      AROM   Right Knee Extension --   WNL   Right Knee Flexion 105   pain   Left Knee Extension --   WNL   Left Knee Flexion 122      Strength   Overall  Strength Comments sartorious 4+/5 with pain RLE    Right Hip Flexion 5/5    Right Hip Extension 4+/5    Right Hip ABduction 4+/5    Right Hip ADduction 4+/5   pain   Left Hip Flexion 5/5    Left Hip Extension 4+/5    Left Hip ABduction 4+/5    Left Hip ADduction 5/5    Right Knee Flexion 4+/5   pain with medial biased   Right Knee Extension 4+/5    Left Knee Flexion 5/5    Left Knee Extension 5/5      Flexibility   Soft Tissue Assessment /Muscle Length yes    Hamstrings WNL, pain with HS 90/90 RLE    Quadriceps tightness bilaterally      Palpation   Patella mobility Rt hypomobile    Palpation comment TTP pes anserine      Special Tests   Other special tests (-) Anterior Drawer (-) Posterior Drawer (-) Valgus Stress (-) Varus Stress (-) McMurray's                        Objective measurements completed on examination: See above findings.       [place] Adult PT Treatment/Exercise - 03/29/21 0001       Self-Care   Self-Care Other Self-Care Comments    Other Self-Care Comments  see patient education                     PT Education - 03/29/21 1716     Education Details Education on current condition, POC, HEP, modalities for pain control, knee anatomy.    Person(s) Educated Patient    Methods Explanation;Demonstration;Verbal cues;Handout    Comprehension Verbalized understanding;Returned demonstration;Verbal cues required                 PT Long Term Goals - 03/29/21 1714       PT LONG TERM GOAL #1   Title Patient Tammy demonstrate at least 120 degrees of Rt knee flexion AROM to improve ability to complete squatting activity.    Baseline 105    Status New    Target Date 05/10/21      PT LONG  TERM GOAL #2   Title Patient Tammy demonstrate 5/5 pain free Rt knee strength to improve stability with walking.    Baseline see flowsheet    Status New    Target Date 05/10/21      PT LONG TERM GOAL #3   Title Patient Tammy demonstrate 5/5  pain free Rt hip strength to improve stability about the chain with prolonged walking and standing.    Baseline see flowsheet    Status New    Target Date 05/10/21      PT LONG TERM GOAL #4   Title Patient Tammy be independent with advanced home program to progress/maintain current level of function.    Status New    Target Date 05/10/21                    Plan - 03/29/21 1658     Clinical Impression Statement Patient is a 45 y/o female who presents to O[facility]T with chief complaint of anteromedial Rt knee pain that began following a 6 hour car ride to/from [place] coupled with a 3.1 mile walk while in [place] in September 2022. She reports onset of pain the morning following this event. Her signs and symptoms are consistent with pes anserine bursitis. She is noted to have palpable tenderness about pes anserine and weakness/pain with MMT of the musculature that inserts at this location. She has limited knee flexion AROM and tightness about bilateral quadriceps. She Tammy benefit from skilled PT to address the above stated deficits in order to optimize her function.    Personal Factors and Comorbidities Profession    Examination-Activity Limitations Stand;Locomotion Level;Sit    Examination-Participation Restrictions Driving;Shop    Stability/Clinical Decision Making Stable/Uncomplicated    Clinical Decision Making Low    Rehab Potential Excellent    PT Frequency 1x / week    PT Duration --   4-6 weeks   PT Treatment/Interventions ADLs/Self Care Home Management;Cryotherapy;Electrical Stimulation;Iontophoresis 4mg /ml Dexamethasone;Moist Heat;Ultrasound;Gait training;Stair training;Functional mobility training;Therapeutic activities;Therapeutic exercise;Balance training;Neuromuscular re-education;Patient/family education;Manual techniques;Passive range of motion;Dry needling;Taping;Vasopneumatic Device    PT Next Visit Plan consider ionto, review HEP, knee/hip strenghtening    PT Home  Exercise Plan Access Code: [email or web address]    Consulted and Agree with Plan of Care Patient             Patient Tammy benefit from skilled therapeutic intervention in order to improve the following deficits and impairments:  Decreased range of motion, Difficulty walking, Pain, Impaired flexibility, Decreased strength  Visit Diagnosis: Acute pain of right knee  Stiffness of right knee, not elsewhere classified  Muscle weakness (generalized)  Difficulty in walking, not elsewhere classified     Problem List Patient Active Problem List   Diagnosis Date Noted   Acute pain of right knee 03/14/2021   Hematochezia 04/22/2013   Urinary tract infection symptoms 04/22/2013   Gwendolyn Grant, PT, DPT, ATC 03/29/21 5:20 PM  PHYSICAL THERAPY DISCHARGE SUMMARY  Visits from Start of Care: 1  Current functional level related to goals / functional outcomes: No re-assessment of goals.    Remaining deficits: Status unknown.    Education / Equipment: N/a   Patient agrees to discharge. Patient goals were not met. Patient is being discharged due to not returning since the last visit.  Gwendolyn Grant, PT, DPT, ATC 04/26/21 5:28 PM  [facility] [address] [place], [place], [address] Phone: [phone]   Fax:  [phone]  Name: Tammy Scott MRN: [id number]  Date of Birth: May 20, 1976

## 2021-04-11 ENCOUNTER — Other Ambulatory Visit: Payer: Self-pay

## 2021-04-11 ENCOUNTER — Encounter (HOSPITAL_COMMUNITY): Payer: Self-pay | Admitting: Emergency Medicine

## 2021-04-11 ENCOUNTER — Ambulatory Visit (HOSPITAL_COMMUNITY)
Admission: EM | Admit: 2021-04-11 | Discharge: 2021-04-11 | Disposition: A | Discharge status: Home / Self Care | Payer: 59 | Attending: Emergency Medicine | Admitting: Emergency Medicine

## 2021-04-11 DIAGNOSIS — R051 Acute cough: Secondary | ICD-10-CM | POA: Diagnosis not present

## 2021-04-11 DIAGNOSIS — R0981 Nasal congestion: Secondary | ICD-10-CM | POA: Diagnosis not present

## 2021-04-11 DIAGNOSIS — Z20828 Contact with and (suspected) exposure to other viral communicable diseases: Secondary | ICD-10-CM | POA: Diagnosis not present

## 2021-04-11 LAB — POCT URINALYSIS DIPSTICK, ED / UC
Bilirubin Urine: NEGATIVE
Glucose, UA: NEGATIVE mg/dL
Ketones, ur: NEGATIVE mg/dL
Leukocytes,Ua: NEGATIVE
Nitrite: NEGATIVE
Protein, ur: NEGATIVE mg/dL
Specific Gravity, Urine: 1.02 (ref 1.005–1.030)
Urobilinogen, UA: 0.2 mg/dL (ref 0.0–1.0)
pH: 7.5 (ref 5.0–8.0)

## 2021-04-11 MED ORDER — BENZONATATE 100 MG PO CAPS
100.0000 mg | ORAL_CAPSULE | Freq: Three times a day (TID) | ORAL | 0 refills | Status: DC
Start: 2021-04-11 — End: 2022-02-18

## 2021-04-11 MED ORDER — OSELTAMIVIR PHOSPHATE 75 MG PO CAPS
75.0000 mg | ORAL_CAPSULE | Freq: Two times a day (BID) | ORAL | 0 refills | Status: AC
Start: 2021-04-11 — End: 2021-04-18

## 2021-04-11 MED ORDER — OSELTAMIVIR PHOSPHATE 75 MG PO CAPS
75.0000 mg | ORAL_CAPSULE | Freq: Two times a day (BID) | ORAL | 0 refills | Status: DC
Start: 2021-04-11 — End: 2021-04-11

## 2021-04-11 MED ORDER — FLUTICASONE PROPIONATE 50 MCG/ACT NA SUSP
2.0000 | Freq: Every day | NASAL | 0 refills | Status: DC
Start: 2021-04-11 — End: 2022-06-21

## 2021-04-11 NOTE — ED Provider Notes (Signed)
[facility]    CSN: [id number] Arrival date & time: 04/11/21  7846      History   Chief Complaint Chief Complaint  Patient presents with   Cough   Sore Throat    HPI Tammy Scott is a 45 y.o. female.   HPI  Cough: Pt reports that since yesterday she has had sore throat, cough, body aches, back pain, headache. Asks for UA to ensure no sign of UTI given low back ache. No dysuria. Lucila Maine has the flu. She has tried nothing yet but rest and hydration for symptoms. No fevers, chest pain, wheezing, SOB.   Past Medical History:  Diagnosis Date   Abnormal pap    Chlamydia    History of gonorrhea     Patient Active Problem List   Diagnosis Date Noted   Acute pain of right knee 03/14/2021   Hematochezia 04/22/2013   Urinary tract infection symptoms 04/22/2013    Past Surgical History:  Procedure Laterality Date   COLONOSCOPY N/A 04/30/2013   Procedure: COLONOSCOPY;  Surgeon: [place] Bali, MD;  Location: [facility];  Service: Endoscopy;  Laterality: N/A;  1:15   Colyposcopy     WISDOM TOOTH EXTRACTION      OB History     Gravida  3   Para  2   Term  2   Preterm      AB  1   Living  2      SAB  1   IAB      Ectopic      Multiple      Live Births               Home Medications    Prior to Admission medications   Medication Sig Start Date End Date Taking? Authorizing Provider  acetaminophen (TYLENOL) 500 MG tablet Take 1,000 mg by mouth every 6 (six) hours as needed for moderate pain or headache.    [provider]  cholecalciferol (VITAMIN D) 1000 UNITS tablet Take 1,000 Units by mouth daily.    [provider]  fexofenadine (ALLEGRA) 180 MG tablet Take 320 mg by mouth daily as needed for allergies or rhinitis.    [provider]  levonorgestrel (MIRENA) 20 MCG/24HR IUD 1 each by Intrauterine route once. Patient not taking: Reported on 03/29/2021    [provider]  medroxyPROGESTERone  (DEPO-PROVERA) 150 MG/ML injection INJECT 1 ML INTRAMUSCULAR EVERY 3 MONTHS Patient not taking: Reported on 03/29/2021 04/24/20 04/24/21  Steva Ready, DO  Multiple Vitamin (MULTIVITAMIN) tablet Take 1 tablet by mouth daily.    [provider]  tiZANidine (ZANAFLEX) 4 MG capsule Take 1 capsule (4 mg total) by mouth 3 (three) times daily as needed for muscle spasms. 03/08/21   Raspet, Noberto Retort, PA-C    Family History Family History  Problem Relation Age of Onset   Cancer Mother        breast   Colon polyps Mother     Social History Social History   Tobacco Use   Smoking status: Former    Packs/day: 1.00    Years: 3.00    Pack years: 3.00    Types: Cigarettes    Quit date: 06/10/2005    Years since quitting: 15.8   Smokeless tobacco: Never  Substance Use Topics   Alcohol use: Yes    Comment: socially   Drug use: No     Allergies   Patient has no known allergies.  Review of Systems Review of Systems  As stated above in HPI Physical Exam Triage Vital Signs ED Triage Vitals  Enc Vitals Group     BP 04/11/21 0937 (!) 145/89     Pulse Rate 04/11/21 0937 67     Resp 04/11/21 0937 16     Temp 04/11/21 0937 98.1 F (36.7 C)     Temp Source 04/11/21 0937 Oral     SpO2 04/11/21 0937 98 %     Weight --      Height --      Head Circumference --      Peak Flow --      Pain Score 04/11/21 0936 4     Pain Loc --      Pain Edu? --      Excl. in GC? --    No data found.  Updated Vital Signs BP (!) 145/89 (BP Location: Left Arm)   Pulse 67   Temp 98.1 F (36.7 C) (Oral)   Resp 16   SpO2 98%   Physical Exam Vitals and nursing note reviewed.  Constitutional:      General: She is not in acute distress.    Appearance: She is well-developed. She is not ill-appearing, toxic-appearing or diaphoretic.  HENT:     Head: Normocephalic and atraumatic.     Right Ear: A middle ear effusion is present. Tympanic membrane is not erythematous.     Left Ear: A middle  ear effusion is present. Tympanic membrane is not erythematous.     Nose: Congestion and rhinorrhea present.     Mouth/Throat:     Mouth: Mucous membranes are moist.     Pharynx: Oropharynx is clear. No oropharyngeal exudate, posterior oropharyngeal erythema or uvula swelling.     Tonsils: No tonsillar exudate or tonsillar abscesses.  Eyes:     Conjunctiva/sclera: Conjunctivae normal.     Pupils: Pupils are equal, round, and reactive to light.  Cardiovascular:     Rate and Rhythm: Normal rate and regular rhythm.     Heart sounds: Normal heart sounds.  Pulmonary:     Effort: Pulmonary effort is normal.     Breath sounds: Normal breath sounds.  Abdominal:     Palpations: Abdomen is soft.  Musculoskeletal:     Cervical back: Normal range of motion and neck supple.  Lymphadenopathy:     Cervical: No cervical adenopathy.  Skin:    General: Skin is warm.  Neurological:     Mental Status: She is alert and oriented to person, place, and time.     UC Treatments / Results  Labs (all labs ordered are listed, but only abnormal results are displayed) Labs Reviewed  POCT URINALYSIS DIPSTICK, ED / UC - Abnormal; Notable for the following components:      Result Value   Hgb urine dipstick TRACE (*)    All other components within normal limits    EKG   Radiology No results found.  Procedures Procedures (including critical care time)  Medications Ordered in UC Medications - No data to display  Initial Impression / Assessment and Plan / UC Course  I have reviewed the triage vital signs and the nursing notes.  Pertinent labs & imaging results that were available during my care of the patient were reviewed by me and considered in my medical decision making (see chart for details).     New. Given exposure and symptoms I am treating her for influenza. UA non-suggestive of UTI. Discussed medications  along with recommendations for rest and hydration with water. Red flag signs and  symptoms- follow up PRN. Work note given.  Final Clinical Impressions(s) / UC Diagnoses   Final diagnoses:  None   Discharge Instructions   None    ED Prescriptions   None    PDMP not reviewed this encounter.   Rushie Chestnut, [place] 04/11/21 1018

## 2021-04-11 NOTE — ED Notes (Signed)
Pt adds that she had issues with her holding her urination and making her back hurt.

## 2021-04-11 NOTE — ED Triage Notes (Signed)
Pt reports having sore throat, cough, body ache, back pains, headache since yesterday. Tammy Scott has flu.

## 2021-04-14 ENCOUNTER — Ambulatory Visit: Payer: 59 | Attending: Family Medicine | Admitting: Physical Therapy

## 2021-04-14 ENCOUNTER — Telehealth: Payer: Self-pay | Admitting: Physical Therapy

## 2021-04-14 NOTE — Telephone Encounter (Signed)
Called and informed patient of missed visit and provided reminder of next appt and attendance policy.  Pt recently in ED for flu like sxs.

## 2021-04-19 ENCOUNTER — Telehealth: Payer: Self-pay

## 2021-04-19 ENCOUNTER — Ambulatory Visit: Payer: 59

## 2021-04-19 NOTE — Telephone Encounter (Signed)
PT called and spoke with patient regarding missed visit. Confirmed next appointment and reminded her of attendance policy.  Can only schedule one visit at a time due to attendance.   Eloy End, PT, DPT 04/19/21 4:46 PM

## 2021-04-25 ENCOUNTER — Ambulatory Visit: Payer: 59 | Admitting: Family Medicine

## 2021-04-25 NOTE — Progress Notes (Deleted)
   Tammy Scott is a 45 y.o. female who presents to [facility] today for the following:  Right Knee Pain F/U Last seen for the same 10/5 Has had negative XR and negative DVT US previously after being seen at [facility] Thought to be OA vs meniscal injury Referred to PT  ***   PMH reviewed. *** ROS as above. Medications reviewed.  Exam:  There were no vitals taken for this visit. Gen: Well NAD MSK:  No results found.   Assessment and Plan: 1) No problem-specific Assessment & Plan notes found for this encounter.   Luis Abed, D.O.  PGY-4 [facility]  04/25/2021 8:23 AM

## 2021-04-26 ENCOUNTER — Telehealth: Payer: Self-pay

## 2021-04-26 ENCOUNTER — Ambulatory Visit: Payer: 59

## 2021-04-26 NOTE — Telephone Encounter (Signed)
Left voicemail regarding patient's third missed PT appointment. Reviewed attendance policy explaining to patient that she would be discharged at this time and require a new referral to continue with PT.

## 2022-02-13 ENCOUNTER — Encounter (HOSPITAL_BASED_OUTPATIENT_CLINIC_OR_DEPARTMENT_OTHER): Payer: Self-pay | Admitting: Emergency Medicine

## 2022-02-13 ENCOUNTER — Emergency Department (HOSPITAL_BASED_OUTPATIENT_CLINIC_OR_DEPARTMENT_OTHER): Payer: Medicaid Other | Admitting: Radiology

## 2022-02-13 ENCOUNTER — Emergency Department (HOSPITAL_BASED_OUTPATIENT_CLINIC_OR_DEPARTMENT_OTHER)
Admission: EM | Admit: 2022-02-13 | Discharge: 2022-02-14 | Disposition: A | Discharge status: Home / Self Care | Payer: Medicaid Other | Attending: Emergency Medicine | Admitting: Emergency Medicine

## 2022-02-13 ENCOUNTER — Emergency Department (HOSPITAL_BASED_OUTPATIENT_CLINIC_OR_DEPARTMENT_OTHER): Payer: Medicaid Other

## 2022-02-13 ENCOUNTER — Other Ambulatory Visit: Payer: Self-pay

## 2022-02-13 DIAGNOSIS — Y92410 Unspecified street and highway as the place of occurrence of the external cause: Secondary | ICD-10-CM | POA: Insufficient documentation

## 2022-02-13 DIAGNOSIS — T2162XA Corrosion of second degree of abdominal wall, initial encounter: Secondary | ICD-10-CM

## 2022-02-13 DIAGNOSIS — T2122XA Burn of second degree of abdominal wall, initial encounter: Secondary | ICD-10-CM | POA: Diagnosis present

## 2022-02-13 MED ORDER — IBUPROFEN 800 MG PO TABS
800.0000 mg | ORAL_TABLET | Freq: Once | ORAL | Status: AC
  Administered 2022-02-14: 800 mg via ORAL
  Filled 2022-02-13: qty 1

## 2022-02-13 NOTE — ED Notes (Signed)
Dressing applied to wound. Education given to pt on wound care, pt verbalized understanding.
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
## 2022-02-13 NOTE — ED Notes (Signed)
Pt's wound on left side cleaned per request. Cool rag left on side per request

## 2022-02-14 MED ORDER — NAPROXEN 500 MG PO TABS: 500.0000 mg | ORAL_TABLET | Freq: Two times a day (BID) | ORAL | 0 refills | Status: DC

## 2022-02-14 MED ORDER — METHOCARBAMOL 500 MG PO TABS: 500.0000 mg | ORAL_TABLET | Freq: Two times a day (BID) | ORAL | 0 refills | Status: DC
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
## 2022-02-18 ENCOUNTER — Ambulatory Visit
Admission: RE | Admit: 2022-02-18 | Discharge: 2022-02-18 | Disposition: A | Discharge status: Home / Self Care | Payer: Medicaid Other | Source: Ambulatory Visit | Attending: Emergency Medicine | Admitting: Emergency Medicine

## 2022-02-18 VITALS — BP 155/88 | HR 59 | Temp 98.6°F | Resp 16

## 2022-02-18 DIAGNOSIS — T2122XD Burn of second degree of abdominal wall, subsequent encounter: Secondary | ICD-10-CM

## 2022-02-18 MED ORDER — SILVER SULFADIAZINE 1 % EX CREA
TOPICAL_CREAM | Freq: Once | CUTANEOUS | Status: AC
  Administered 2022-02-18: 1 via TOPICAL

## 2022-02-18 MED ORDER — SILVER SULFADIAZINE 1 % EX CREA: 1.0000 | TOPICAL_CREAM | Freq: Every day | CUTANEOUS | 0 refills | Status: DC

## 2022-02-18 NOTE — Discharge Instructions (Addendum)
Keep clean with soap and water.  Apply Silvadene once a day and keep it dressed.  I have placed an urgent referral to the wound care center for further management.  Start taking the Naprosyn along with 1000 mg of Tylenol that the ED prescribed to you.  This will help with pain as well.
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
## 2022-02-26 ENCOUNTER — Encounter (HOSPITAL_BASED_OUTPATIENT_CLINIC_OR_DEPARTMENT_OTHER): Payer: Medicaid Other | Attending: General Surgery | Admitting: General Surgery

## 2022-02-26 DIAGNOSIS — T2122XA Burn of second degree of abdominal wall, initial encounter: Secondary | ICD-10-CM | POA: Insufficient documentation

## 2022-02-26 DIAGNOSIS — Z87891 Personal history of nicotine dependence: Secondary | ICD-10-CM | POA: Diagnosis not present

## 2022-02-26 DIAGNOSIS — E668 Other obesity: Secondary | ICD-10-CM | POA: Diagnosis not present

## 2022-02-26 NOTE — Progress Notes (Signed)
Tammy Scott, Tammy Scott ([id number]) Visit Report for 02/26/2022 Abuse Risk Screen Details Patient Name: Date of Service: Tammy Scott, [name] 02/26/2022 8:00 A M Medical Record Number: [id number] Patient Account Number: [id number] Date of Birth/Sex: Treating RN: 1975/06/17 (46 y.o. Tammy Scott Primary Care Rayson Rando: PA Zenovia Jordan, NO Other Clinician: Referring Blaize Epple: Treating Fidencio Duddy/Extender: Camelia Eng MES, DA V ID Weeks in Treatment: 0 Abuse Risk Screen Items Answer ABUSE RISK SCREEN: Has anyone close to you tried to hurt or harm you recentlyo No Do you feel uncomfortable with anyone in your familyo No Has anyone forced you do things that you didnt want to doo No Electronic Signature(s) Signed: 02/26/2022 5:29:50 PM By: Karie Schwalbe RN Entered By: Karie Schwalbe on 02/26/2022 08:23:40 -------------------------------------------------------------------------------- Activities of Daily Living Details Patient Name: Date of Service: Tammy Scott, [name] 02/26/2022 8:00 A M Medical Record Number: [id number] Patient Account Number: [id number] Date of Birth/Sex: Treating RN: Apr 12, 1976 (46 y.o. Tammy Scott Primary Care Jeannetta Cerutti: PA Zenovia Jordan, NO Other Clinician: Referring Calysta Craigo: Treating Tomeeka Plaugher/Extender: Camelia Eng MES, DA V ID Weeks in Treatment: 0 Activities of Daily Living Items Answer Activities of Daily Living (Please select one for each item) Drive Automobile Completely Able T Medications ake Completely Able Use T elephone Completely Able Care for Appearance Completely Able Use T oilet Completely Able Bath / Shower Completely Able Dress Self Completely Able Feed Self Completely Able Walk Completely Able Get In / Out Bed Completely Able Housework Completely Able Prepare Meals Completely Able Handle Money Completely Able Shop for Self Completely Able Electronic Signature(s) Signed: 02/26/2022 5:29:50 PM By: Karie Schwalbe RN Entered By:  Karie Schwalbe on 02/26/2022 08:24:14 -------------------------------------------------------------------------------- Education Screening Details Patient Name: Date of Service: Tammy Scott, [name] 02/26/2022 8:00 A M Medical Record Number: [id number] Patient Account Number: [id number] Date of Birth/Sex: Treating RN: September 08, 1975 (46 y.o. Tammy Scott Primary Care Mercy Leppla: PA Zenovia Jordan, NO Other Clinician: Referring Morrison Masser: Treating Simona Rocque/Extender: Camelia Eng MES, DA V ID Weeks in Treatment: 0 Primary Learner Assessed: Patient Learning Preferences/Education Level/Primary Language Learning Preference: Explanation, Demonstration, Printed Material Highest Education Level: College or Above Preferred Language: Economist Language Barrier: No Translator Needed: No Memory Deficit: No Emotional Barrier: No Cultural/Religious Beliefs Affecting Medical Care: No Physical Barrier Impaired Vision: No Impaired Hearing: No Decreased Hand dexterity: No Knowledge/Comprehension Knowledge Level: High Comprehension Level: High Ability to understand written instructions: High Ability to understand verbal instructions: High Motivation Anxiety Level: Calm Cooperation: Cooperative Education Importance: Acknowledges Need Interest in Health Problems: Asks Questions Perception: Coherent Willingness to Engage in Self-Management High Activities: Readiness to Engage in Self-Management High Activities: Electronic Signature(s) Signed: 02/26/2022 5:29:50 PM By: Karie Schwalbe RN Entered By: Karie Schwalbe on 02/26/2022 08:25:36 -------------------------------------------------------------------------------- Fall Risk Assessment Details Patient Name: Date of Service: Tammy Scott, [name] 02/26/2022 8:00 A M Medical Record Number: [id number] Patient Account Number: [id number] Date of Birth/Sex: Treating RN: 09-Jun-1976 (46 y.o. Tammy Scott Primary Care  Dora Simeone: PA Zenovia Jordan, NO Other Clinician: Referring Celestina Gironda: Treating Jessicia Napolitano/Extender: Camelia Eng MES, DA V ID Weeks in Treatment: 0 Fall Risk Assessment Items Have you had 2 or more falls in the last 12 monthso 0 No Have you had any fall that resulted in injury in the last 12 monthso 0 No FALLS RISK SCREEN History of falling - immediate or within 3 months 0 No Secondary diagnosis (Do you have 2 or more medical diagnoseso) 0 No Ambulatory aid None/bed rest/wheelchair/nurse 0 No Crutches/cane/walker  0 No Furniture 0 No Intravenous therapy Access/Saline/Heparin Lock 0 No Gait/Transferring Normal/ bed rest/ wheelchair 0 No Weak (short steps with or without shuffle, stooped but able to lift head while walking, may seek 0 No support from furniture) Impaired (short steps with shuffle, may have difficulty arising from chair, head down, impaired 0 No balance) Mental Status Oriented to own ability 0 No Electronic Signature(s) Signed: 02/26/2022 5:29:50 PM By: Dellie Catholic RN Entered By: Dellie Catholic on 02/26/2022 08:25:44 -------------------------------------------------------------------------------- Foot Assessment Details Patient Name: Date of Service: Tammy Scott, [name] 02/26/2022 8:00 A M Medical Record Number: [id number] Patient Account Number: [id number] Date of Birth/Sex: Treating RN: 11/23/1975 (46 y.o. Tammy Scott Primary Care Jaquia Benedicto: PA TIENT, NO Other Clinician: Referring Jakel Alphin: Treating Helon Wisinski/Extender: Nyra Jabs MES, DA V ID Weeks in Treatment: 0 Foot Assessment Items Site Locations + = Sensation present, - = Sensation absent, C = Callus, U = Ulcer R = Redness, W = Warmth, M = Maceration, PU = Pre-ulcerative lesion F = Fissure, S = Swelling, D = Dryness Assessment Right: Left: Other Deformity: No No Prior Foot Ulcer: No No Prior Amputation: No No Charcot Joint: No No Ambulatory Status: Ambulatory Without Help Gait:  Steady Electronic Signature(s) Signed: 02/26/2022 5:29:50 PM By: Dellie Catholic RN Entered By: Dellie Catholic on 02/26/2022 08:26:33 -------------------------------------------------------------------------------- Nutrition Risk Screening Details Patient Name: Date of Service: Tammy Scott, [name] 02/26/2022 8:00 A M Medical Record Number: [id number] Patient Account Number: [id number] Date of Birth/Sex: Treating RN: Dec 25, 1975 (46 y.o. Tammy Scott Primary Care Jamiel Goncalves: PA Haig Prophet, NO Other Clinician: Referring Dorie Ohms: Treating Emilliano Dilworth/Extender: Nyra Jabs MES, DA V ID Weeks in Treatment: 0 Height (in): 64 Weight (lbs): 230 Body Mass Index (BMI): 39.5 Nutrition Risk Screening Items Score Screening NUTRITION RISK SCREEN: I have an illness or condition that made me change the kind and/or amount of food I eat 0 No I eat fewer than two meals per day 0 No I eat few fruits and vegetables, or milk products 0 No I have three or more drinks of beer, liquor or wine almost every day 0 No I have tooth or mouth problems that make it hard for me to eat 0 No I don't always have enough money to buy the food I need 0 No I eat alone most of the time 0 No I take three or more different prescribed or over-the-counter drugs a day 0 No Without wanting to, I have lost or gained 10 pounds in the last six months 0 No I am not always physically able to shop, cook and/or feed myself 0 No Nutrition Protocols Good Risk Protocol 0 No interventions needed Moderate Risk Protocol High Risk Proctocol Risk Level: Good Risk Score: 0 Electronic Signature(s) Signed: 02/26/2022 5:29:50 PM By: Dellie Catholic RN Entered By: Dellie Catholic on 02/26/2022 59:93:57
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
## 2022-04-10 ENCOUNTER — Ambulatory Visit (INDEPENDENT_AMBULATORY_CARE_PROVIDER_SITE_OTHER): Payer: Self-pay

## 2022-04-10 ENCOUNTER — Ambulatory Visit
Admission: RE | Admit: 2022-04-10 | Discharge: 2022-04-10 | Disposition: A | Discharge status: Home / Self Care | Payer: Medicaid Other | Source: Ambulatory Visit | Attending: Internal Medicine | Admitting: Internal Medicine

## 2022-04-10 VITALS — BP 164/83 | HR 68 | Temp 98.5°F | Resp 16

## 2022-04-10 DIAGNOSIS — S39012A Strain of muscle, fascia and tendon of lower back, initial encounter: Secondary | ICD-10-CM

## 2022-04-10 DIAGNOSIS — Z3202 Encounter for pregnancy test, result negative: Secondary | ICD-10-CM

## 2022-04-10 DIAGNOSIS — M545 Low back pain, unspecified: Secondary | ICD-10-CM

## 2022-04-10 DIAGNOSIS — R350 Frequency of micturition: Secondary | ICD-10-CM

## 2022-04-10 LAB — POCT URINALYSIS DIP (MANUAL ENTRY)
Bilirubin, UA: NEGATIVE
Blood, UA: NEGATIVE
Glucose, UA: NEGATIVE mg/dL
Ketones, POC UA: NEGATIVE mg/dL
Leukocytes, UA: NEGATIVE
Nitrite, UA: NEGATIVE
Spec Grav, UA: 1.02 (ref 1.010–1.025)
Urobilinogen, UA: 1 E.U./dL
pH, UA: 7 (ref 5.0–8.0)

## 2022-04-10 LAB — POCT URINE PREGNANCY: Preg Test, Ur: NEGATIVE

## 2022-04-10 MED ORDER — NAPROXEN 500 MG PO TABS: 500.0000 mg | ORAL_TABLET | Freq: Two times a day (BID) | ORAL | 0 refills | Status: DC

## 2022-04-10 MED ORDER — TIZANIDINE HCL 4 MG PO TABS: 4.0000 mg | ORAL_TABLET | Freq: Every day | ORAL | 0 refills | Status: DC

## 2022-04-10 NOTE — ED Provider Notes (Signed)
[facility] - URGENT CARE CENTER  Note:  This document was prepared using Conservation officer, historic buildings and may include unintentional dictation errors.  MRN: [id number] DOB: 04-02-1976  Subjective:   Tammy Scott is a 46 y.o. female presenting for 1 week history of persistent low back pain.  Symptoms started across the entire low back but are now in the left side.  She has concerns about a possible kidney infection as she has previously had 1 of these.  Has also had urinary frequency.  No fever, dysuria, hematuria, radicular symptoms, fall, trauma, saddle paresthesia.  Patient does work at a [employer] development [employer] and also does side work as a Lawyer.  Tries to practice good back care.  Has a history of foraminal stenosis and neck issues, cervical radiculopathy.  Would like an x-ray of her low back.  No current facility-administered medications for this encounter.  Current Outpatient Medications:    acetaminophen (TYLENOL) 500 MG tablet, Take 1,000 mg by mouth every 6 (six) hours as needed for moderate pain or headache., Disp: , Rfl:    cholecalciferol (VITAMIN D) 1000 UNITS tablet, Take 1,000 Units by mouth daily., Disp: , Rfl:    fexofenadine (ALLEGRA) 180 MG tablet, Take 320 mg by mouth daily as needed for allergies or rhinitis., Disp: , Rfl:    fluticasone (FLONASE) 50 MCG/ACT nasal spray, Place 2 sprays into both nostrils daily., Disp: 16 mL, Rfl: 0   levonorgestrel (MIRENA) 20 MCG/24HR IUD, 1 each by Intrauterine route once. (Patient not taking: Reported on 03/29/2021), Disp: , Rfl:    medroxyPROGESTERone (DEPO-PROVERA) 150 MG/ML injection, INJECT 1 ML INTRAMUSCULAR EVERY 3 MONTHS (Patient not taking: Reported on 03/29/2021), Disp: 1 mL, Rfl: 1   methocarbamol (ROBAXIN) 500 MG tablet, Take 1 tablet (500 mg total) by mouth 2 (two) times daily., Disp: 20 tablet, Rfl: 0   Multiple Vitamin (MULTIVITAMIN) tablet, Take 1 tablet by mouth daily., Disp: , Rfl:    naproxen (NAPROSYN) 500 MG  tablet, Take 1 tablet (500 mg total) by mouth 2 (two) times daily., Disp: 30 tablet, Rfl: 0   silver sulfADIAZINE (SILVADENE) 1 % cream, Apply 1 Application topically daily., Disp: 50 g, Rfl: 0   No Known Allergies  Past Medical History:  Diagnosis Date   Abnormal pap    Chlamydia    History of gonorrhea      Past Surgical History:  Procedure Laterality Date   COLONOSCOPY N/A 04/30/2013   Procedure: COLONOSCOPY;  Surgeon: West Bali, MD;  Location: [facility];  Service: Endoscopy;  Laterality: N/A;  1:15   Colyposcopy     WISDOM TOOTH EXTRACTION      Family History  Problem Relation Age of Onset   Cancer Mother        breast   Colon polyps Mother     Social History   Tobacco Use   Smoking status: Former    Packs/day: 1.00    Years: 3.00    Total pack years: 3.00    Types: Cigarettes    Quit date: 06/10/2005    Years since quitting: 16.8   Smokeless tobacco: Never  Substance Use Topics   Alcohol use: Yes    Comment: socially   Drug use: No    ROS   Objective:   Vitals: BP (!) 164/83 (BP Location: Left Arm)   Pulse 68   Temp 98.5 F (36.9 C) (Oral)   Resp 16   LMP 12/24/2021   SpO2 98%   Physical  Exam Constitutional:      General: She is not in acute distress.    Appearance: Normal appearance. She is well-developed. She is not ill-appearing, toxic-appearing or diaphoretic.  HENT:     Head: Normocephalic and atraumatic.     Nose: Nose normal.     Mouth/Throat:     Mouth: Mucous membranes are moist.  Eyes:     General: No scleral icterus.       Right eye: No discharge.        Left eye: No discharge.     Extraocular Movements: Extraocular movements intact.     Conjunctiva/sclera: Conjunctivae normal.  Cardiovascular:     Rate and Rhythm: Normal rate.  Pulmonary:     Effort: Pulmonary effort is normal.  Abdominal:     Tenderness: There is no right CVA tenderness or left CVA tenderness.  Musculoskeletal:     Lumbar back: Tenderness (left  paraspinal muscles) present. No swelling, edema, deformity, signs of trauma, lacerations, spasms or bony tenderness. Normal range of motion. Negative right straight leg raise test and negative left straight leg raise test. No scoliosis.  Skin:    General: Skin is warm and dry.  Neurological:     General: No focal deficit present.     Mental Status: She is alert and oriented to person, place, and time.     Motor: No weakness.     Coordination: Coordination normal.     Gait: Gait normal.     Deep Tendon Reflexes: Reflexes normal.  Psychiatric:        Mood and Affect: Mood normal.        Behavior: Behavior normal.        Thought Content: Thought content normal.        Judgment: Judgment normal.     Results for orders placed or performed during the hospital encounter of 04/10/22 (from the past 24 hour(s))  POCT urinalysis dipstick     Status: Abnormal   Collection Time: 04/10/22  6:40 PM  Result Value Ref Range   Color, UA yellow yellow   Clarity, UA clear clear   Glucose, UA negative negative mg/dL   Bilirubin, UA negative negative   Ketones, POC UA negative negative mg/dL   Spec Grav, UA 1.696 7.893 - 1.025   Blood, UA negative negative   pH, UA 7.0 5.0 - 8.0   Protein Ur, POC trace (A) negative mg/dL   Urobilinogen, UA 1.0 0.2 or 1.0 E.U./dL   Nitrite, UA Negative Negative   Leukocytes, UA Negative Negative  POCT urine pregnancy     Status: None   Collection Time: 04/10/22  6:40 PM  Result Value Ref Range   Preg Test, Ur Negative Negative   DG Lumbar Spine Complete  Result Date: 04/10/2022 CLINICAL DATA:  46 year old female pre ascends for evaluation of low back pain for 1 week. EXAM: LUMBAR SPINE - COMPLETE 4+ VIEW COMPARISON:  None available aside from CT of the abdomen and pelvis performed on Oct 24, 2019. FINDINGS: Mild degenerative changes throughout the lumbar spine. Five lumbar type vertebral bodies. Minimal disc space narrowing at L1-2 and L2-3. Otherwise with  preservation of disc spaces and vertebral body heights. No malalignment. IMPRESSION: Mild degenerative changes throughout the lumbar spine. No acute findings. Electronically Signed   By: Donzetta Kohut M.D.   On: 04/10/2022 18:57     Assessment and Plan :   PDMP not reviewed this encounter.  1. Lumbar strain, initial encounter   2. Acute  bilateral low back pain without sciatica   3. Urinary frequency      Urine culture pending, will hold off on antibiotics for acute cystitis, pyelonephritis. Will manage conservatively for back strain with NSAID and muscle relaxant, rest and modification of physical activity.  Anticipatory guidance provided.  Counseled patient on potential for adverse effects with medications prescribed/recommended today, ER and return-to-clinic precautions discussed, patient verbalized understanding.    Jaynee Eagles, PA-C 04/10/22 1950

## 2022-04-10 NOTE — ED Triage Notes (Signed)
Lower back pain, intermittent, x 1 week. Last time this happened, states there was a kidney infection. Reports left side hurts worse than right side. Reports when she's urinated recently it's felt warmer than usual, also felt some urinary urgency.  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

## 2022-06-21 ENCOUNTER — Encounter: Payer: Self-pay | Admitting: Nurse Practitioner

## 2022-06-21 ENCOUNTER — Ambulatory Visit (INDEPENDENT_AMBULATORY_CARE_PROVIDER_SITE_OTHER): Payer: Self-pay | Admitting: Nurse Practitioner

## 2022-06-21 VITALS — BP 134/88 | HR 72 | Temp 98.3°F | Ht 64.0 in | Wt 215.0 lb

## 2022-06-21 DIAGNOSIS — N939 Abnormal uterine and vaginal bleeding, unspecified: Secondary | ICD-10-CM | POA: Insufficient documentation

## 2022-06-21 DIAGNOSIS — R030 Elevated blood-pressure reading, without diagnosis of hypertension: Secondary | ICD-10-CM

## 2022-06-21 DIAGNOSIS — Z6836 Body mass index (BMI) 36.0-36.9, adult: Secondary | ICD-10-CM

## 2022-06-21 DIAGNOSIS — Z124 Encounter for screening for malignant neoplasm of cervix: Secondary | ICD-10-CM

## 2022-06-21 DIAGNOSIS — N3946 Mixed incontinence: Secondary | ICD-10-CM | POA: Insufficient documentation

## 2022-06-21 DIAGNOSIS — Z1239 Encounter for other screening for malignant neoplasm of breast: Secondary | ICD-10-CM | POA: Insufficient documentation

## 2022-06-21 DIAGNOSIS — R809 Proteinuria, unspecified: Secondary | ICD-10-CM

## 2022-06-21 DIAGNOSIS — H538 Other visual disturbances: Secondary | ICD-10-CM

## 2022-06-21 DIAGNOSIS — M545 Low back pain, unspecified: Secondary | ICD-10-CM

## 2022-06-21 LAB — URINALYSIS WITH CULTURE, IF INDICATED
Bilirubin Urine: NEGATIVE
Hgb urine dipstick: NEGATIVE
Ketones, ur: NEGATIVE
Leukocytes,Ua: NEGATIVE
Nitrite: NEGATIVE
Specific Gravity, Urine: 1.015 (ref 1.000–1.030)
Urine Glucose: NEGATIVE
Urobilinogen, UA: 1 (ref 0.0–1.0)
pH: 8.5 — AB (ref 5.0–8.0)

## 2022-06-21 LAB — LIPID PANEL
Cholesterol: 171 mg/dL (ref 0–200)
HDL: 80.5 mg/dL (ref >39.00)
LDL Cholesterol: 75 mg/dL (ref 0–99)
NonHDL: 90.01
Total CHOL/HDL Ratio: 2
Triglycerides: 74 mg/dL (ref 0.0–149.0)
VLDL: 14.8 mg/dL (ref 0.0–40.0)

## 2022-06-21 LAB — COMPREHENSIVE METABOLIC PANEL
ALT: 12 U/L (ref 0–35)
AST: 16 U/L (ref 0–37)
Albumin: 4.3 g/dL (ref 3.5–5.2)
Alkaline Phosphatase: 48 U/L (ref 39–117)
BUN: 12 mg/dL (ref 6–23)
CO2: 30 mEq/L (ref 19–32)
Calcium: 9.6 mg/dL (ref 8.4–10.5)
Chloride: 102 mEq/L (ref 96–112)
Creatinine, Ser: 0.82 mg/dL (ref 0.40–1.20)
GFR: 87.14 mL/min (ref >60.00)
Glucose, Bld: 84 mg/dL (ref 70–99)
Potassium: 4.3 mEq/L (ref 3.5–5.1)
Sodium: 138 mEq/L (ref 135–145)
Total Bilirubin: 0.4 mg/dL (ref 0.2–1.2)
Total Protein: 7.9 g/dL (ref 6.0–8.3)

## 2022-06-21 LAB — CBC
HCT: 40.7 % (ref 36.0–46.0)
Hemoglobin: 13.7 g/dL (ref 12.0–15.0)
MCHC: 33.6 g/dL (ref 30.0–36.0)
MCV: 90.8 fl (ref 78.0–100.0)
Platelets: 324 10*3/uL (ref 150.0–400.0)
RBC: 4.49 Mil/uL (ref 3.87–5.11)
RDW: 12.6 % (ref 11.5–15.5)
WBC: 4.4 10*3/uL (ref 4.0–10.5)

## 2022-06-21 LAB — POCT URINE PREGNANCY: Preg Test, Ur: NEGATIVE

## 2022-06-21 LAB — MICROALBUMIN / CREATININE URINE RATIO
Creatinine,U: 128.3 mg/dL
Microalb Creat Ratio: 0.5 mg/g (ref 0.0–30.0)
Microalb, Ur: <0.7 mg/dL (ref 0.0–1.9)

## 2022-06-21 LAB — HEMOGLOBIN A1C: Hgb A1c MFr Bld: 5.9 % (ref 4.6–6.5)

## 2022-06-21 LAB — TSH: TSH: 1.98 u[IU]/mL (ref 0.35–5.50)

## 2022-06-21 NOTE — Assessment & Plan Note (Signed)
Intermittent, will refer to ophthalmology for further testing and evaluation recommendations.

## 2022-06-21 NOTE — Assessment & Plan Note (Signed)
Referral to OB/GYN made today for patient to undergo Pap smear.

## 2022-06-21 NOTE — Assessment & Plan Note (Signed)
Bilateral screening mammogram ordered today, further recommendations may be made based upon these results.

## 2022-06-21 NOTE — Assessment & Plan Note (Addendum)
Has evidence of both stress and urge incontinence.  Per shared decision making patient would like to be evaluated by physical therapy for pelvic floor strengthening.  Referral made today.  May need to consider treatment of overactive bladder in the future as well.  Patient educated to make sure she hydrates with water regularly, uses the bathroom when she feels the need to as opposed to holding it, consider making dietary and lifestyle changes aimed at helping her with modest weight loss as well.  Will also check urine for signs of UTI.

## 2022-06-21 NOTE — Addendum Note (Signed)
Addended by: Ferol Luz, Tayelor Osborne P on: 06/21/2022 10:17 AM   Modules accepted: Orders

## 2022-06-21 NOTE — Assessment & Plan Note (Signed)
Will collect urinalysis and possibly further testing pending these results.

## 2022-06-21 NOTE — Assessment & Plan Note (Signed)
Denies history of hypertension, blood pressure slightly elevated today.  Will monitor closely consider initiating treatment for blood pressure if remains elevated.

## 2022-06-21 NOTE — Assessment & Plan Note (Signed)
Chronic, recommend use of ice, heat, over-the-counter pain patches, Tylenol, and possibly ibuprofen pending kidney function testing today.  Will also refer to physical therapy for assistance with treatment of low back pain.  If low back pain persists will consider referral to spine specialist.

## 2022-06-21 NOTE — Assessment & Plan Note (Signed)
Labs ordered today for further evaluation, further recommendations may be made based upon these results. 

## 2022-06-21 NOTE — Assessment & Plan Note (Signed)
Chronic, referral to OB/GYN made today for assistance with evaluation and management.  Point-of-care urine pregnancy test today negative.
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
## 2022-07-15 ENCOUNTER — Telehealth: Payer: Self-pay | Admitting: Nurse Practitioner

## 2022-07-15 NOTE — Telephone Encounter (Signed)
Patient wants to know if she needs to reschedule her appointment since she can't get in to see any of her referrals before her appointment with Jeralyn Ruths on 08/02/22. She is also wondering if she needs a different referral to the GYN since they can't get her in until April. She said she was supposed to have a referral for a mammogram and that she hasn't heard anything from them. Best callback number is [phone]

## 2022-07-15 NOTE — Telephone Encounter (Signed)
Referral for GYN was  sent to [facility]

## 2022-07-15 NOTE — Telephone Encounter (Signed)
Left pt detail message in regards to keeping her appointment, also let her know to call us back to let us be aware which OBGYN office she will like to change it to and also I do not see any referral for a mammogram to be done.

## 2022-08-02 ENCOUNTER — Ambulatory Visit: Payer: Medicaid Other | Admitting: Nurse Practitioner

## 2022-09-20 ENCOUNTER — Encounter: Payer: Self-pay | Admitting: Family Medicine

## 2022-09-20 ENCOUNTER — Ambulatory Visit (INDEPENDENT_AMBULATORY_CARE_PROVIDER_SITE_OTHER): Payer: Medicaid Other | Admitting: Family Medicine

## 2022-09-20 ENCOUNTER — Other Ambulatory Visit (HOSPITAL_COMMUNITY)
Admission: RE | Admit: 2022-09-20 | Discharge: 2022-09-20 | Disposition: A | Discharge status: Home / Self Care | Payer: Medicaid Other | Source: Ambulatory Visit | Attending: Family Medicine | Admitting: Family Medicine

## 2022-09-20 VITALS — BP 161/87 | HR 60 | Wt 213.0 lb

## 2022-09-20 DIAGNOSIS — Z124 Encounter for screening for malignant neoplasm of cervix: Secondary | ICD-10-CM | POA: Insufficient documentation

## 2022-09-20 DIAGNOSIS — R030 Elevated blood-pressure reading, without diagnosis of hypertension: Secondary | ICD-10-CM

## 2022-09-20 DIAGNOSIS — Z01419 Encounter for gynecological examination (general) (routine) without abnormal findings: Secondary | ICD-10-CM

## 2022-09-20 DIAGNOSIS — N914 Secondary oligomenorrhea: Secondary | ICD-10-CM | POA: Insufficient documentation

## 2022-09-20 DIAGNOSIS — Z01411 Encounter for gynecological examination (general) (routine) with abnormal findings: Secondary | ICD-10-CM

## 2022-09-20 DIAGNOSIS — Z1231 Encounter for screening mammogram for malignant neoplasm of breast: Secondary | ICD-10-CM

## 2022-09-20 DIAGNOSIS — E041 Nontoxic single thyroid nodule: Secondary | ICD-10-CM | POA: Insufficient documentation

## 2022-09-20 HISTORY — DX: Nontoxic single thyroid nodule: E04.1

## 2022-09-20 NOTE — Assessment & Plan Note (Signed)
On PE--will get u/s for diagnostic and need for possible bx.

## 2022-09-20 NOTE — Assessment & Plan Note (Signed)
24235 - f/u PCP

## 2022-09-20 NOTE — Assessment & Plan Note (Signed)
Check labs and u/s--not on any meds at present. Normal TSH recently.

## 2022-09-20 NOTE — Patient Instructions (Signed)

## 2022-09-20 NOTE — Progress Notes (Signed)
Subjective:     Tammy Scott is a 47 y.o. female and is here for a comprehensive physical exam. The patient reports problems - irregular cycles . Cycles were 7 days then 5 then 3. On Depo for a long time. Switched to Mirena, had pain and bleeding, Has had Nexplanon - had this removed. No contraceptive method other than condoms x 5 years.  Has not had a regular cycle since 5 years ago.  Cycles can be irregular. Has some hot flashes/night sweats. 6 months ago, cycles became more irregular.   The following portions of the patient's history were reviewed and updated as appropriate: allergies, current medications, past family history, past medical history, past social history, past surgical history, and problem list.  Review of Systems Pertinent items noted in HPI and remainder of comprehensive ROS otherwise negative.   Objective:    BP (!) 161/87   Pulse 60   Wt 213 lb (96.6 kg)   BMI 36.56 kg/m  General appearance: alert, cooperative, and appears stated age Head: Normocephalic, without obvious abnormality, atraumatic Neck: no adenopathy, supple, symmetrical, trachea midline, and thyroid: solitary nodule 1.2 x 1.2 cm mobile to the left noted Lungs: clear to auscultation bilaterally Heart: regular rate and rhythm, S1, S2 normal, no murmur, click, rub or gallop Abdomen: soft, non-tender; bowel sounds normal; no masses,  no organomegaly Pelvic: cervix normal in appearance, external genitalia normal, no adnexal masses or tenderness, no cervical motion tenderness, uterus normal size, shape, and consistency, and vagina normal without discharge Extremities: extremities normal, atraumatic, no cyanosis or edema Pulses: 2+ and symmetric Skin: Skin color, texture, turgor normal. No rashes or lesions Lymph nodes: Cervical, supraclavicular, and axillary nodes normal. Neurologic: Grossly normal    Assessment:   GYN female exam.      Plan:   Problem List Items Addressed This Visit        Unprioritized   Elevated blood pressure reading    99204 - f/u PCP      Secondary oligomenorrhea    Check labs and u/s--not on any meds at present. Normal TSH recently.      Relevant Orders   US PELVIC COMPLETE WITH TRANSVAGINAL   Prolactin   Thyroid nodule    On PE--will get u/s for diagnostic and need for possible bx.      Relevant Orders   US THYROID   RESOLVED: Encounter for screening for malignant neoplasm of breast   Other Visit Diagnoses     Screening for malignant neoplasm of cervix    -  Primary   [phone]   Relevant Orders   Cytology - PAP   FSH   Encounter for gynecological examination with abnormal finding       99386      Return in 1 year (on 09/20/2023).    See After Visit Summary for Counseling Recommendations

## 2022-09-21 LAB — PROLACTIN: Prolactin: 10.8 ng/mL (ref 4.8–33.4)

## 2022-09-21 LAB — FOLLICLE STIMULATING HORMONE: FSH: 42.3 m[IU]/mL

## 2022-09-25 LAB — CYTOLOGY - PAP
Adequacy: ABSENT
Comment: NEGATIVE
Diagnosis: UNDETERMINED — AB
High risk HPV: NEGATIVE

## 2022-10-10 ENCOUNTER — Ambulatory Visit
Admission: RE | Admit: 2022-10-10 | Discharge: 2022-10-10 | Disposition: A | Discharge status: Home / Self Care | Payer: Self-pay | Source: Ambulatory Visit | Attending: Family Medicine | Admitting: Family Medicine

## 2022-10-10 ENCOUNTER — Encounter: Payer: Self-pay | Admitting: Family Medicine

## 2022-10-10 DIAGNOSIS — E041 Nontoxic single thyroid nodule: Secondary | ICD-10-CM

## 2022-10-10 DIAGNOSIS — N914 Secondary oligomenorrhea: Secondary | ICD-10-CM

## 2022-10-29 ENCOUNTER — Ambulatory Visit
Admission: RE | Admit: 2022-10-29 | Discharge: 2022-10-29 | Disposition: A | Discharge status: Home / Self Care | Payer: Self-pay | Source: Ambulatory Visit | Attending: Nurse Practitioner | Admitting: Nurse Practitioner

## 2022-10-29 DIAGNOSIS — Z1239 Encounter for other screening for malignant neoplasm of breast: Secondary | ICD-10-CM

## 2022-11-13 ENCOUNTER — Encounter (HOSPITAL_COMMUNITY): Payer: Self-pay

## 2022-11-13 ENCOUNTER — Telehealth: Payer: Self-pay | Admitting: *Deleted

## 2022-11-13 ENCOUNTER — Emergency Department (HOSPITAL_COMMUNITY): Payer: No Typology Code available for payment source

## 2022-11-13 ENCOUNTER — Emergency Department (HOSPITAL_COMMUNITY)
Admission: EM | Admit: 2022-11-13 | Discharge: 2022-11-13 | Disposition: A | Discharge status: Home / Self Care | Payer: No Typology Code available for payment source | Attending: Emergency Medicine | Admitting: Emergency Medicine

## 2022-11-13 ENCOUNTER — Telehealth (HOSPITAL_COMMUNITY): Payer: Self-pay | Admitting: Emergency Medicine

## 2022-11-13 ENCOUNTER — Other Ambulatory Visit: Payer: Self-pay

## 2022-11-13 ENCOUNTER — Telehealth: Payer: Self-pay

## 2022-11-13 DIAGNOSIS — Z87891 Personal history of nicotine dependence: Secondary | ICD-10-CM | POA: Diagnosis not present

## 2022-11-13 DIAGNOSIS — Y92410 Unspecified street and highway as the place of occurrence of the external cause: Secondary | ICD-10-CM | POA: Diagnosis not present

## 2022-11-13 DIAGNOSIS — S52502A Unspecified fracture of the lower end of left radius, initial encounter for closed fracture: Secondary | ICD-10-CM | POA: Diagnosis not present

## 2022-11-13 DIAGNOSIS — S59912A Unspecified injury of left forearm, initial encounter: Secondary | ICD-10-CM | POA: Diagnosis present

## 2022-11-13 MED ORDER — HYDROCODONE-ACETAMINOPHEN 5-325 MG PO TABS: 2.0000 | ORAL_TABLET | ORAL | 0 refills | Status: DC | PRN

## 2022-11-13 MED ORDER — HYDROCODONE-ACETAMINOPHEN 5-325 MG PO TABS
1.0000 | ORAL_TABLET | Freq: Once | ORAL | Status: AC | PRN
  Administered 2022-11-13: 1 via ORAL
  Filled 2022-11-13: qty 1

## 2022-11-13 MED ORDER — IBUPROFEN 400 MG PO TABS
600.0000 mg | ORAL_TABLET | Freq: Once | ORAL | Status: AC
  Administered 2022-11-13: 600 mg via ORAL
  Filled 2022-11-13: qty 1

## 2022-11-13 MED ORDER — HYDROCODONE-ACETAMINOPHEN 5-325 MG PO TABS: 1.0000 | ORAL_TABLET | Freq: Four times a day (QID) | ORAL | 0 refills | Status: DC | PRN

## 2022-11-13 NOTE — Telephone Encounter (Signed)
duplicate

## 2022-11-13 NOTE — Telephone Encounter (Signed)
Pt called regarding pharmacy Rx was sent to was closed by the time she arrived; requested Rx be sent to 24 hour pharmacy.  RNCM sent request to EDP [facility] [address].

## 2022-11-13 NOTE — Discharge Instructions (Addendum)
Tammy Scott:  Thank you for allowing [place] to take care of you today.  We hope you begin feeling better soon.  To-Do: Please follow-up with orthopedic surgery.  Call Dr. Carola Frost to set up an appointment tomorrow. Take scheduled ibuprofen 600 mg every 6 hours as needed for pain.  If this is insufficient use Norco as needed for severe breakthrough pain, 1 tablet as needed every 6 hours. Please return to the Emergency Department or call 911 if you experience chest pain, shortness of breath, severe pain, severe fever, altered mental status, or have any reason to think that you need emergency medical care.  Thank you again.  Hope you feel better soon.  Department of Emergency Medicine [facility]
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
## 2022-11-13 NOTE — Progress Notes (Signed)
Orthopedic Tech Progress Note Patient Details:  Tammy Scott 06-30-1975 [id number]  Ortho Devices Type of Ortho Device: Sugartong splint, Arm sling Ortho Device/Splint Location: LUE Ortho Device/Splint Interventions: Application   Post Interventions Patient Tolerated: Well  [name] 11/13/2022, 5:52 PM

## 2022-11-13 NOTE — Telephone Encounter (Signed)
RNCM called pt to inform her that [facility] [address] [facility].
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
## 2022-11-13 NOTE — Telephone Encounter (Signed)
Patient requesting change in pharmacy due to her pharmacy being closed.  Prescription sent to new pharmacy.

## 2022-11-13 NOTE — Telephone Encounter (Signed)
Received inbound call from patient indicating she was recently discharged from [facility] ED. Patient reports the pharmacy it was writing to closed at 6pm. Patient is requesting Rx (hydrocodone acetaminophen) be sent to [facility] on [address]. This RNCM notified EDP Wallace Cullens to send RX to [facility] on [address]. TOC awaiting a response from EDP.

## 2022-11-22 ENCOUNTER — Ambulatory Visit (INDEPENDENT_AMBULATORY_CARE_PROVIDER_SITE_OTHER): Payer: Self-pay | Admitting: Nurse Practitioner

## 2022-11-22 VITALS — BP 118/84 | HR 82 | Temp 98.7°F | Ht 64.0 in | Wt 216.0 lb

## 2022-11-22 DIAGNOSIS — Z113 Encounter for screening for infections with a predominantly sexual mode of transmission: Secondary | ICD-10-CM | POA: Insufficient documentation

## 2022-11-22 DIAGNOSIS — Z1159 Encounter for screening for other viral diseases: Secondary | ICD-10-CM | POA: Insufficient documentation

## 2022-11-22 DIAGNOSIS — R030 Elevated blood-pressure reading, without diagnosis of hypertension: Secondary | ICD-10-CM

## 2022-11-22 DIAGNOSIS — R809 Proteinuria, unspecified: Secondary | ICD-10-CM

## 2022-11-22 LAB — URINALYSIS WITH CULTURE, IF INDICATED
Bilirubin Urine: NEGATIVE
Hgb urine dipstick: NEGATIVE
Ketones, ur: NEGATIVE
Leukocytes,Ua: NEGATIVE
Nitrite: NEGATIVE
RBC / HPF: NONE SEEN (ref 0–?)
Specific Gravity, Urine: 1.02 (ref 1.000–1.030)
Total Protein, Urine: NEGATIVE
Urine Glucose: NEGATIVE
Urobilinogen, UA: 0.2 (ref 0.0–1.0)
WBC, UA: NONE SEEN (ref 0–?)
pH: 7.5 (ref 5.0–8.0)

## 2022-11-22 LAB — BASIC METABOLIC PANEL
BUN: 14 mg/dL (ref 6–23)
CO2: 29 mEq/L (ref 19–32)
Calcium: 9.3 mg/dL (ref 8.4–10.5)
Chloride: 101 mEq/L (ref 96–112)
Creatinine, Ser: 0.65 mg/dL (ref 0.40–1.20)
GFR: 106.94 mL/min (ref >60.00)
Glucose, Bld: 82 mg/dL (ref 70–99)
Potassium: 4.1 mEq/L (ref 3.5–5.1)
Sodium: 136 mEq/L (ref 135–145)

## 2022-11-22 NOTE — Assessment & Plan Note (Signed)
Elevated last office visit, today is normal. For now continue off antihypertensive.

## 2022-11-22 NOTE — Assessment & Plan Note (Signed)
Labs ordered, further recommendations may be made based upon his results. 

## 2022-11-22 NOTE — Assessment & Plan Note (Addendum)
Repeat UA, if protein present refer to nephrology
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
## 2022-11-23 LAB — RPR: RPR Ser Ql: NONREACTIVE

## 2022-11-23 LAB — HIV ANTIBODY (ROUTINE TESTING W REFLEX): HIV 1&2 Ab, 4th Generation: NONREACTIVE

## 2022-11-23 LAB — HEPATITIS C ANTIBODY: Hepatitis C Ab: NONREACTIVE

## 2022-11-25 LAB — CHLAMYDIA/NEISSERIA GONORRHOEAE RNA,TMA,UROGENTIAL
C. trachomatis RNA, TMA: NOT DETECTED
N. gonorrhoeae RNA, TMA: NOT DETECTED

## 2022-12-09 ENCOUNTER — Ambulatory Visit: Payer: Medicaid Other | Admitting: Family Medicine

## 2023-01-21 ENCOUNTER — Emergency Department (HOSPITAL_COMMUNITY): Payer: Self-pay

## 2023-01-21 ENCOUNTER — Emergency Department (HOSPITAL_COMMUNITY)
Admission: EM | Admit: 2023-01-21 | Discharge: 2023-01-21 | Disposition: A | Discharge status: Home / Self Care | Payer: Self-pay | Attending: Emergency Medicine | Admitting: Emergency Medicine

## 2023-01-21 ENCOUNTER — Other Ambulatory Visit: Payer: Self-pay

## 2023-01-21 ENCOUNTER — Encounter (HOSPITAL_COMMUNITY): Payer: Self-pay

## 2023-01-21 DIAGNOSIS — M25552 Pain in left hip: Secondary | ICD-10-CM | POA: Insufficient documentation

## 2023-01-21 DIAGNOSIS — R609 Edema, unspecified: Secondary | ICD-10-CM | POA: Diagnosis not present

## 2023-01-21 LAB — PREGNANCY, URINE: Preg Test, Ur: NEGATIVE

## 2023-01-21 MED ORDER — CYCLOBENZAPRINE HCL 10 MG PO TABS: 10.0000 mg | ORAL_TABLET | Freq: Three times a day (TID) | ORAL | 0 refills | Status: AC

## 2023-01-21 MED ORDER — KETOROLAC TROMETHAMINE 15 MG/ML IJ SOLN
15.0000 mg | Freq: Once | INTRAMUSCULAR | Status: AC
  Administered 2023-01-21: 15 mg via INTRAMUSCULAR
  Filled 2023-01-21: qty 1

## 2023-01-21 MED ORDER — NAPROXEN 500 MG PO TABS: 500.0000 mg | ORAL_TABLET | Freq: Two times a day (BID) | ORAL | 0 refills | Status: AC

## 2023-01-21 MED ORDER — CYCLOBENZAPRINE HCL 10 MG PO TABS
10.0000 mg | ORAL_TABLET | Freq: Once | ORAL | Status: AC
  Administered 2023-01-21: 10 mg via ORAL
  Filled 2023-01-21: qty 1

## 2023-01-21 MED ORDER — LIDOCAINE 5 % EX PTCH
1.0000 | MEDICATED_PATCH | CUTANEOUS | Status: DC
  Administered 2023-01-21: 1 via TRANSDERMAL
  Filled 2023-01-21: qty 1
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
## 2023-01-21 NOTE — Discharge Instructions (Addendum)
The x-ray of your left hip did not show any fracture or dislocation.  I have prescribed a short course of anti-inflammatories to help with your pain, please take 1 tablet twice a day with food for the next 7 days.  In addition, you are prescribed muscle relaxers you will need to take 1 tablet up to 3 times a day to help with pain control.  Please be aware this medication can make you drowsy, do not drink alcohol or drive while taking this medication.  The phone number to Dr. Carola Frost of orthopedics is attached to your chart should you need further evaluation of your left hip pain.
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
## 2023-01-21 NOTE — ED Notes (Signed)
Patient transported to X-ray 

## 2023-01-22 ENCOUNTER — Telehealth: Payer: Self-pay

## 2023-01-22 NOTE — Transitions of Care (Post Inpatient/ED Visit) (Signed)
   01/22/2023  Name: Tammy Scott MRN: [id number] DOB: 1976-05-14  Today's TOC FU Call Status: Today's TOC FU Call Status:: Unsuccessful Call (1st Attempt) Unsuccessful Call (1st Attempt) Date: 01/22/23  Attempted to reach the patient regarding the most recent Inpatient/ED visit.  Follow Up Plan: Additional outreach attempts will be made to reach the patient to complete the Transitions of Care (Post Inpatient/ED visit) call.   Signature   Woodfin Ganja LPN [facility] Nurse Health Advisor Direct Dial [phone]

## 2023-02-03 ENCOUNTER — Other Ambulatory Visit: Payer: Self-pay

## 2023-02-03 ENCOUNTER — Ambulatory Visit (INDEPENDENT_AMBULATORY_CARE_PROVIDER_SITE_OTHER): Payer: Medicaid Other | Admitting: Family Medicine

## 2023-02-03 VITALS — BP 147/89 | HR 61 | Wt 207.8 lb

## 2023-02-03 DIAGNOSIS — N939 Abnormal uterine and vaginal bleeding, unspecified: Secondary | ICD-10-CM

## 2023-02-03 DIAGNOSIS — N3946 Mixed incontinence: Secondary | ICD-10-CM

## 2023-02-03 DIAGNOSIS — Z30013 Encounter for initial prescription of injectable contraceptive: Secondary | ICD-10-CM | POA: Diagnosis not present

## 2023-02-03 MED ORDER — MEDROXYPROGESTERONE ACETATE 150 MG/ML IM SUSP
150.0000 mg | Freq: Once | INTRAMUSCULAR | Status: AC
Start: 2023-02-03 — End: 2023-02-03
  Administered 2023-02-03: 150 mg via INTRAMUSCULAR

## 2023-02-03 NOTE — Progress Notes (Signed)
   Subjective:    Patient ID: Tammy Scott is a 47 y.o. female presenting with Follow-up  on 02/03/2023  HPI: Having irregular cycles and they have become light. FSH was 42 at last check. She strongly desires to return to Depo. Has failed IUD and Nexplanon. Prefers none to irregular and unpredictable cycles. Leaking urine from time to time. Leaks with coughing and squatting. Also has some urge, noted when she cannot get to the bathroom in reasonable amount of time.  Review of Systems  Constitutional:  Negative for chills and fever.  Respiratory:  Negative for shortness of breath.   Cardiovascular:  Negative for chest pain.  Gastrointestinal:  Negative for abdominal pain, nausea and vomiting.  Genitourinary:  Negative for dysuria.  Skin:  Negative for rash.      Objective:    BP (!) 141/90   Pulse 67   Wt 207 lb 12.8 oz (94.3 kg)   BMI 35.67 kg/m  Physical Exam Exam conducted with a chaperone present.  Constitutional:      General: She is not in acute distress.    Appearance: She is well-developed.  HENT:     Head: Normocephalic and atraumatic.  Eyes:     General: No scleral icterus. Cardiovascular:     Rate and Rhythm: Normal rate.  Pulmonary:     Effort: Pulmonary effort is normal.  Abdominal:     Palpations: Abdomen is soft.  Musculoskeletal:     Cervical back: Neck supple.  Skin:    General: Skin is warm and dry.  Neurological:     Mental Status: She is alert and oriented to person, place, and time.         Assessment & Plan:   Problem List Items Addressed This Visit       Unprioritized   Mixed incontinence - Primary    Will refer to UroGyn to see if there are treatment options she can try.      Relevant Orders   Ambulatory referral to Urogynecology   Abnormal uterine bleeding    Likely related to  peri-menopause, but depo will likely cause cessation. She has not been on Depo for > 8 years but previously tolerated it well. Would consider repeat  FSH and trial off in about 1-2 years.        Return in about 3 months (around 05/06/2023) for Depo Provera.  Reva Bores, MD 02/03/2023 9:12 AM

## 2023-02-03 NOTE — Assessment & Plan Note (Signed)
Will refer to UroGyn to see if there are treatment options she can try.

## 2023-02-03 NOTE — Assessment & Plan Note (Signed)
Likely related to  peri-menopause, but depo will likely cause cessation. She has not been on Depo for > 8 years but previously tolerated it well. Would consider repeat FSH and trial off in about 1-2 years.

## 2023-02-06 ENCOUNTER — Other Ambulatory Visit: Payer: Self-pay | Admitting: Orthopedic Surgery

## 2023-02-06 DIAGNOSIS — Z780 Asymptomatic menopausal state: Secondary | ICD-10-CM

## 2023-02-09 ENCOUNTER — Encounter (HOSPITAL_COMMUNITY): Payer: Self-pay | Admitting: Emergency Medicine

## 2023-02-09 ENCOUNTER — Emergency Department (HOSPITAL_COMMUNITY)
Admission: EM | Admit: 2023-02-09 | Discharge: 2023-02-10 | Disposition: A | Discharge status: Home / Self Care | Payer: Medicaid Other

## 2023-02-09 ENCOUNTER — Other Ambulatory Visit: Payer: Self-pay

## 2023-02-09 DIAGNOSIS — S8990XA Unspecified injury of unspecified lower leg, initial encounter: Secondary | ICD-10-CM | POA: Diagnosis not present

## 2023-02-09 DIAGNOSIS — R1032 Left lower quadrant pain: Secondary | ICD-10-CM

## 2023-02-09 DIAGNOSIS — M25552 Pain in left hip: Secondary | ICD-10-CM | POA: Insufficient documentation

## 2023-02-09 DIAGNOSIS — R000 Tachycardia, unspecified: Secondary | ICD-10-CM | POA: Diagnosis not present

## 2023-02-09 MED ORDER — OXYCODONE-ACETAMINOPHEN 5-325 MG PO TABS: 1.0000 | ORAL_TABLET | Freq: Four times a day (QID) | ORAL | 0 refills | Status: DC | PRN

## 2023-02-09 MED ORDER — OXYCODONE-ACETAMINOPHEN 5-325 MG PO TABS
1.0000 | ORAL_TABLET | Freq: Once | ORAL | Status: AC
  Administered 2023-02-10: 1 via ORAL
  Filled 2023-02-09: qty 1

## 2023-02-09 NOTE — ED Provider Notes (Signed)
[place] EMERGENCY DEPARTMENT AT [facility] Provider Note   CSN: [id number] Arrival date & time: 02/09/23  2127     History {Add pertinent medical, surgical, social history, OB history to HPI:1} Chief Complaint  Patient presents with   Leg Pain    Tammy Scott is a 47 y.o. female patient with no past medical history presenting to the emergency room with left hip pain that has been ongoing for about a month now.  The pain is sharp and cramping and radiates down anterior posterior side of leg.  She denies any loss of bowel or bladder.  No numbness or tingling.  Pain is worse with palpation and movement.  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  She has tried ice heat muscle relaxer and naproxen.  She reports she has taken muscle relaxer and naproxen prior to arrival and does help bring the pain down slightly but not for long.  Patient reports family history of breast cancer and diabetes but she does not have any past medical history of her own. NKA    Leg Pain      Home Medications Prior to Admission medications   Medication Sig Start Date End Date Taking? Authorizing Provider  acetaminophen (TYLENOL) 500 MG tablet Take 1,000 mg by mouth every 6 (six) hours as needed for moderate pain or headache.    [provider]  cholecalciferol (VITAMIN D) 1000 UNITS tablet Take 1,000 Units by mouth daily.    [provider]  fexofenadine (ALLEGRA) 180 MG tablet Take 320 mg by mouth daily as needed for allergies or rhinitis.    [provider]  HYDROcodone-acetaminophen (NORCO/VICODIN) 5-325 MG tablet Take 2 tablets by mouth every 4 (four) hours as needed. 11/13/22   Chase Caller, MD  Multiple Vitamin (MULTIVITAMIN) tablet Take 1 tablet by mouth daily.    [provider]      Allergies    Patient has no known allergies.    Review of Systems   Review of Systems  Musculoskeletal:        Hip pain    Physical  Exam Updated Vital Signs BP (!) 149/89 (BP Location: Left Arm)   Pulse 87   Temp 98.2 F (36.8 C) (Oral)   Resp 18   Ht 5\' 4"  (1.626 m)   Wt 93.9 kg   SpO2 100%   BMI 35.53 kg/m  Physical Exam Vitals and nursing note reviewed.  Constitutional:      General: She is not in acute distress.    Appearance: She is not toxic-appearing.  HENT:     Head: Normocephalic and atraumatic.  Eyes:     General: No scleral icterus.    Conjunctiva/sclera: Conjunctivae normal.  Cardiovascular:     Rate and Rhythm: Normal rate and regular rhythm.     Pulses: Normal pulses.     Heart sounds: Normal heart sounds.  Pulmonary:     Effort: Pulmonary effort is normal. No respiratory distress.     Breath sounds: Normal breath sounds.  Abdominal:     General: Abdomen is flat. Bowel sounds are normal.     Palpations: Abdomen is soft.     Tenderness: There is no abdominal tenderness.  Musculoskeletal:        General: Tenderness present.     Comments: TTP left anterior hip, hip flexion weakness secondary to pain.   Skin:    General: Skin is warm and dry.  Findings: No lesion.  Neurological:     General: No focal deficit present.     Mental Status: She is alert and oriented to person, place, and time. Mental status is at baseline.     ED Results / Procedures / Treatments   Labs (all labs ordered are listed, but only abnormal results are displayed) Labs Reviewed - No data to display  EKG None  Radiology No results found.  Procedures Procedures  {Document cardiac monitor, telemetry assessment procedure when appropriate:1}  Medications Ordered in ED Medications - No data to display  ED Course/ Medical Decision Making/ A&P   {   Click here for ABCD2, HEART and other calculatorsREFRESH Note before signing :1}                              Medical Decision Making Risk Prescription drug management.   This patient presents to the ED for concern of left hip pain, this involves an  extensive number of treatment options, and is a complaint that carries with it a high risk of complications and morbidity.  The differential diagnosis includes fracture, hernia, cellulitis, sprain, strain, dvt, cauda equina    Co morbidities that complicate the patient evaluation  Denies   Additional history obtained:  Additional history obtained from *** External records from outside source obtained and reviewed including ***   Lab Tests:  I Ordered, and personally interpreted labs.  The pertinent results include:  ***   Imaging Studies ordered:  None, review recent lumbar and hip    Cardiac Monitoring: / EKG:  The patient was maintained on a cardiac monitor.  I personally viewed and interpreted the cardiac monitored which showed an underlying rhythm of: ***   Consultations Obtained:  I requested consultation with the ***,  and discussed lab and imaging findings as well as pertinent plan - they recommend: ***   Problem List / ED Course / Critical interventions / Medication management  *** I ordered medication including ***  for ***  Reevaluation of the patient after these medicines showed that the patient {resolved/improved/worsened:23923::"improved"} I have reviewed the patients home medicines and have made adjustments as needed   Plan   {Document critical care time when appropriate:1} {Document review of labs and clinical decision tools ie heart score, Chads2Vasc2 etc:1}  {Document your independent review of radiology images, and any outside records:1} {Document your discussion with family members, caretakers, and with consultants:1} {Document social determinants of health affecting pt's care:1} {Document your decision making why or why not admission, treatments were needed:1} Final Clinical Impression(s) / ED Diagnoses Final diagnoses:  None    Rx / DC Orders ED Discharge Orders     None

## 2023-02-09 NOTE — Discharge Instructions (Addendum)
You are seen in the emergency room today for left groin and leg pain.  Call to schedule an appointment with orthopedics for follow up.  Call to schedule an appointment to get venous ultrasound completed.  Return to ER with new or worsening symptoms.

## 2023-02-09 NOTE — ED Triage Notes (Signed)
Patient coming to ED for evaluation of L leg/groin pain.  Reports pain started "around a month ago."  Had xrays done recently and had no fx.  States pain has become more severe and she is having trouble ambulating.  Tingling to leg.  No numbness or weakness.

## 2023-02-10 ENCOUNTER — Ambulatory Visit (HOSPITAL_COMMUNITY)
Admission: RE | Admit: 2023-02-10 | Discharge: 2023-02-10 | Disposition: A | Discharge status: Home / Self Care | Payer: Self-pay | Source: Ambulatory Visit | Attending: Emergency Medicine | Admitting: Emergency Medicine

## 2023-02-10 DIAGNOSIS — R102 Pelvic and perineal pain: Secondary | ICD-10-CM | POA: Insufficient documentation

## 2023-02-10 DIAGNOSIS — I82409 Acute embolism and thrombosis of unspecified deep veins of unspecified lower extremity: Secondary | ICD-10-CM

## 2023-02-17 ENCOUNTER — Ambulatory Visit (INDEPENDENT_AMBULATORY_CARE_PROVIDER_SITE_OTHER): Payer: Self-pay | Admitting: Family Medicine

## 2023-02-17 ENCOUNTER — Encounter: Payer: Self-pay | Admitting: Family Medicine

## 2023-02-17 VITALS — BP 112/74 | HR 80 | Temp 98.7°F | Resp 20 | Ht 64.0 in | Wt 209.0 lb

## 2023-02-17 DIAGNOSIS — R1032 Left lower quadrant pain: Secondary | ICD-10-CM

## 2023-02-17 MED ORDER — OXYCODONE-ACETAMINOPHEN 5-325 MG PO TABS
1.0000 | ORAL_TABLET | Freq: Three times a day (TID) | ORAL | 0 refills | Status: DC | PRN
Start: 2023-02-17 — End: 2023-04-25

## 2023-02-17 MED ORDER — TIZANIDINE HCL 4 MG PO CAPS
4.0000 mg | ORAL_CAPSULE | Freq: Three times a day (TID) | ORAL | 0 refills | Status: DC | PRN
Start: 2023-02-17 — End: 2023-04-25

## 2023-02-17 NOTE — Patient Instructions (Signed)
The following foods that may reduce pain: 1) Ginger (especially studied for arthritis)- reduce leukotriene production to decrease inflammation 2) Blueberries- high in phytonutrients that decrease inflammation 3) Salmon- marine omega-3s reduce joint swelling and pain 4) Pumpkin seeds- reduce inflammation 5) dark chocolate- reduces inflammation 6) turmeric- reduces inflammation 7) tart cherries - reduce pain and stiffness 8) extra virgin olive oil - its compound olecanthal helps to block prostaglandins  9) chili peppers- can be eaten or applied topically via capsaicin 10) mint- helpful for headache, muscle aches, joint pain, and itching 11) garlic- reduces inflammation   Link to further information on diet for chronic pain: [email or web address]
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
## 2023-02-20 ENCOUNTER — Telehealth: Payer: Self-pay | Admitting: Nurse Practitioner

## 2023-02-20 NOTE — Telephone Encounter (Signed)
Pt called stating she can't take percocets because it make her stomach upset and she only can take oxycodone. Please advise.

## 2023-02-20 NOTE — Telephone Encounter (Signed)
Disregard message below.

## 2023-03-26 ENCOUNTER — Encounter: Payer: Self-pay | Admitting: *Deleted

## 2023-04-25 ENCOUNTER — Ambulatory Visit: Payer: Medicaid Other

## 2023-04-25 ENCOUNTER — Other Ambulatory Visit: Payer: Self-pay

## 2023-04-25 VITALS — BP 134/77 | HR 60 | Ht 64.0 in | Wt 202.9 lb

## 2023-04-25 DIAGNOSIS — Z3042 Encounter for surveillance of injectable contraceptive: Secondary | ICD-10-CM | POA: Diagnosis not present

## 2023-04-25 MED ORDER — MEDROXYPROGESTERONE ACETATE 150 MG/ML IM SUSY
150.0000 mg | PREFILLED_SYRINGE | Freq: Once | INTRAMUSCULAR | Status: AC
Start: 2023-04-25 — End: 2023-04-25
  Administered 2023-04-25: 150 mg via INTRAMUSCULAR

## 2023-04-25 NOTE — Progress Notes (Signed)
Tammy Scott here for Depo-Provera Injection. Injection administered without complication. Patient will return in 3 months for next injection between 07/11/2023 and 07/24/2022. Next annual visit due 02/03/2024. Next pap due 09/20/2022.   Quintella Reichert, RN 04/25/2023  9:55 AM

## 2023-05-23 ENCOUNTER — Ambulatory Visit (INDEPENDENT_AMBULATORY_CARE_PROVIDER_SITE_OTHER): Payer: BC Managed Care – PPO | Admitting: Nurse Practitioner

## 2023-05-23 VITALS — BP 136/88 | HR 66 | Temp 98.1°F | Ht 64.0 in | Wt 205.4 lb

## 2023-05-23 DIAGNOSIS — E041 Nontoxic single thyroid nodule: Secondary | ICD-10-CM

## 2023-05-23 DIAGNOSIS — Z0001 Encounter for general adult medical examination with abnormal findings: Secondary | ICD-10-CM | POA: Diagnosis not present

## 2023-05-23 DIAGNOSIS — E559 Vitamin D deficiency, unspecified: Secondary | ICD-10-CM | POA: Diagnosis not present

## 2023-05-23 DIAGNOSIS — Z23 Encounter for immunization: Secondary | ICD-10-CM | POA: Diagnosis not present

## 2023-05-23 DIAGNOSIS — Z1211 Encounter for screening for malignant neoplasm of colon: Secondary | ICD-10-CM | POA: Insufficient documentation

## 2023-05-23 LAB — CBC
HCT: 40.4 % (ref 36.0–46.0)
Hemoglobin: 13.2 g/dL (ref 12.0–15.0)
MCHC: 32.6 g/dL (ref 30.0–36.0)
MCV: 92.2 fL (ref 78.0–100.0)
Platelets: 315 10*3/uL (ref 150.0–400.0)
RBC: 4.38 Mil/uL (ref 3.87–5.11)
RDW: 13 % (ref 11.5–15.5)
WBC: 4 10*3/uL (ref 4.0–10.5)

## 2023-05-23 LAB — LIPID PANEL
Cholesterol: 154 mg/dL (ref 0–200)
HDL: 74.3 mg/dL (ref >39.00)
LDL Cholesterol: 66 mg/dL (ref 0–99)
NonHDL: 80.09
Total CHOL/HDL Ratio: 2
Triglycerides: 69 mg/dL (ref 0.0–149.0)
VLDL: 13.8 mg/dL (ref 0.0–40.0)

## 2023-05-23 LAB — COMPREHENSIVE METABOLIC PANEL
ALT: 18 U/L (ref 0–35)
AST: 17 U/L (ref 0–37)
Albumin: 4.2 g/dL (ref 3.5–5.2)
Alkaline Phosphatase: 45 U/L (ref 39–117)
BUN: 11 mg/dL (ref 6–23)
CO2: 27 meq/L (ref 19–32)
Calcium: 9.4 mg/dL (ref 8.4–10.5)
Chloride: 103 meq/L (ref 96–112)
Creatinine, Ser: 0.73 mg/dL (ref 0.40–1.20)
GFR: 99.54 mL/min (ref >60.00)
Glucose, Bld: 73 mg/dL (ref 70–99)
Potassium: 4.1 meq/L (ref 3.5–5.1)
Sodium: 138 meq/L (ref 135–145)
Total Bilirubin: 0.6 mg/dL (ref 0.2–1.2)
Total Protein: 7.8 g/dL (ref 6.0–8.3)

## 2023-05-23 LAB — T3, FREE: T3, Free: 3.3 pg/mL (ref 2.3–4.2)

## 2023-05-23 LAB — TSH: TSH: 1.51 u[IU]/mL (ref 0.35–5.50)

## 2023-05-23 LAB — VITAMIN D 25 HYDROXY (VIT D DEFICIENCY, FRACTURES): VITD: 25.66 ng/mL — ABNORMAL LOW (ref 30.00–100.00)

## 2023-05-23 LAB — HEMOGLOBIN A1C: Hgb A1c MFr Bld: 5.7 % (ref 4.6–6.5)

## 2023-05-23 LAB — T4, FREE: Free T4: 0.76 ng/dL (ref 0.60–1.60)

## 2023-05-23 NOTE — Assessment & Plan Note (Signed)
Chronic, present on physical exam. Ultrasound in May 2024 identified thyroid nodule did not recommend routine follow-up with imaging.  Patient reports nodule has remained stable in size and is not causing any discomfort or dysphagia.  Patient to monitor closely and to reach out if she experiences any dysphagia, increase in size of the nodule.

## 2023-05-23 NOTE — Assessment & Plan Note (Signed)
Due for colon cancer screening. Patient would like to go to a different group than she did last time colonoscopy was completed. Referral to [facility] made today.

## 2023-05-23 NOTE — Progress Notes (Signed)
Complete physical exam  Patient: Tammy Scott   DOB: 1976-03-17   47 y.o. Female  MRN: [id number]  Subjective:    Chief Complaint  Patient presents with   Annual Exam    Tammy Scott is a 47 y.o. female who presents today for a complete physical exam. She reports consuming a general diet.  Exercise: not regular exercise consistently.  She generally feels fairly well. She reports sleeping fairly well. She does not have additional problems to discuss today.    Most recent fall risk assessment:    05/23/2023   10:54 AM  Fall Risk   Falls in the past year? 0  Number falls in past yr: 0  Injury with Fall? 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   Thyroid nodule 09/20/2022   Mixed incontinence 06/21/2022   Proteinuria 06/21/2022   Class 2 severe obesity with serious comorbidity and body mass index (BMI) of 36.0 to 36.9 in adult (HCC) 06/21/2022   Blurry vision 06/21/2022   Low back pain without sciatica 06/21/2022   Abnormal uterine bleeding 06/21/2022   Elevated blood pressure reading 06/21/2022   Acute pain of right knee 03/14/2021   Atypical chest pain 10/27/2017   Hematochezia 04/22/2013   Urinary tract infection symptoms 04/22/2013   Past Medical History:  Diagnosis Date   Abnormal pap    Chlamydia    History of gonorrhea    Thyroid nodule 09/20/2022   Vaginal Pap smear, abnormal    Social History    Socioeconomic History   Marital status: Single    Spouse name: Not on file   Number of children: Not on file   Years of education: Not on file   Highest education level: Some college, no degree  Occupational History   Occupation: full-time Consulting civil engineer   Occupation: Ship broker  Tobacco Use   Smoking status: Former    Current packs/day: 0.00    Average packs/day: 1 pack/day for 3.0 years (3.0 ttl pk-yrs)    Types: Cigarettes    Start date: 06/10/2002    Quit date: 06/10/2005    Years since quitting: 17.9   Smokeless tobacco: Never  Substance and Sexual Activity   Alcohol use: Yes    Comment: socially   Drug use: Yes    Types: Marijuana   Sexual activity: Yes    Birth control/protection: Condom  Other Topics Concern   Not on file  Social History Narrative   Not on file   Social Drivers of Health   Financial Resource Strain: Medium Risk (02/03/2023)   Overall Financial Resource Strain (CARDIA)    Difficulty of Paying Living Expenses: Somewhat hard  Food Insecurity: Food Insecurity Present (02/03/2023)   Hunger Vital Sign    Worried About Running Out of Food in the Last Year: Sometimes true    Ran  Out of Food in the Last Year: Sometimes true  Transportation Needs: Unmet Transportation Needs (02/03/2023)   PRAPARE - Administrator, Civil Service (Medical): Yes    Lack of Transportation (Non-Medical): No  Physical Activity: Unknown (02/03/2023)   Exercise Vital Sign    Days of Exercise per Week: 0 days    Minutes of Exercise per Session: Not on file  Stress: Stress Concern Present (02/03/2023)   [employer] of Occupational Health - Occupational Stress Questionnaire    Feeling of Stress : To some extent  Social Connections: Moderately Isolated (02/03/2023)   Social Connection and Isolation Panel [NHANES]    Frequency of Communication with Friends and Family: Three times a week    Frequency of Social Gatherings with Friends and Family: Once a week    Attends  Religious Services: More than 4 times per year    Active Member of [employer] or Organizations: No    Attends Engineer, structural: Not on file    Marital Status: Never married  Intimate Partner Violence: Not on file   Family History  Problem Relation Age of Onset   Cancer Mother 55       breast   Colon polyps Mother    No Known Allergies    Patient Care Team: Elenore Paddy, NP as PCP - General (Nurse Practitioner)   Outpatient Medications Prior to Visit  Medication Sig   acetaminophen (TYLENOL) 500 MG tablet Take 1,000 mg by mouth every 6 (six) hours as needed for moderate pain or headache.   cholecalciferol (VITAMIN D) 1000 UNITS tablet Take 1,000 Units by mouth daily.   fexofenadine (ALLEGRA) 180 MG tablet Take 320 mg by mouth daily as needed for allergies or rhinitis.   Multiple Vitamin (MULTIVITAMIN) tablet Take 1 tablet by mouth daily.   naproxen (NAPROSYN) 500 MG tablet Take 500 mg by mouth 2 (two) times daily with a meal. PRN   No facility-administered medications prior to visit.    Review of Systems  Constitutional:  Negative for fever and weight loss.  HENT:  Negative for hearing loss and tinnitus.   Eyes:  Positive for blurred vision. Negative for double vision.  Respiratory:  Negative for cough, shortness of breath and wheezing.   Cardiovascular:  Negative for chest pain and palpitations.  Gastrointestinal:  Negative for abdominal pain and blood in stool.  Genitourinary:  Negative for dysuria and hematuria.  Neurological:  Negative for seizures and loss of consciousness.  Psychiatric/Behavioral:  Negative for depression and suicidal ideas. The patient is not nervous/anxious.           Objective:     BP 136/88 (Cuff Size: Large)   Pulse 66   Temp 98.1 F (36.7 C) (Temporal)   Ht 5\' 4"  (1.626 m)   Wt 205 lb 6 oz (93.2 kg)   SpO2 99%   BMI 35.25 kg/m  BP Readings from Last 3 Encounters:  05/23/23 136/88  04/25/23 134/77  02/17/23 112/74   Wt  Readings from Last 3 Encounters:  05/23/23 205 lb 6 oz (93.2 kg)  04/25/23 202 lb 14.4 oz (92 kg)  02/17/23 209 lb (94.8 kg)      05/23/2023   10:54 AM 02/03/2023    8:57 AM 11/22/2022   11:15 AM  PHQ9 SCORE ONLY  PHQ-9 Total Score 0 6 0      Physical Exam Vitals reviewed. Exam conducted with a chaperone present.  Constitutional:      Appearance: Normal  appearance.  HENT:     Head: Normocephalic and atraumatic.     Right Ear: Tympanic membrane, ear canal and external ear normal.     Left Ear: Tympanic membrane, ear canal and external ear normal.  Eyes:     General:        Right eye: No discharge.        Left eye: No discharge.     Extraocular Movements: Extraocular movements intact.     Conjunctiva/sclera: Conjunctivae normal.     Pupils: Pupils are equal, round, and reactive to light.  Neck:     Thyroid: Thyroid mass and thyromegaly present.     Vascular: No carotid bruit.  Cardiovascular:     Rate and Rhythm: Normal rate and regular rhythm.     Pulses: Normal pulses.     Heart sounds: Normal heart sounds. No murmur heard. Pulmonary:     Effort: Pulmonary effort is normal.     Breath sounds: Normal breath sounds.  Chest:  Breasts:    Breasts are symmetrical.     Right: Normal.     Left: Normal.  Abdominal:     General: Abdomen is flat. Bowel sounds are normal. There is no distension.     Palpations: Abdomen is soft. There is no mass.     Tenderness: There is no abdominal tenderness.  Musculoskeletal:        General: No tenderness.     Cervical back: Neck supple. No muscular tenderness.     Right lower leg: No edema.     Left lower leg: No edema.  Lymphadenopathy:     Cervical: No cervical adenopathy.     Upper Body:     Right upper body: No supraclavicular adenopathy.     Left upper body: No supraclavicular adenopathy.  Skin:    General: Skin is warm and dry.  Neurological:     General: No focal deficit present.     Mental Status: She is alert and  oriented to person, place, and time.     Motor: No weakness.     Gait: Gait normal.  Psychiatric:        Mood and Affect: Mood normal.        Behavior: Behavior normal.        Judgment: Judgment normal.      No results found for any visits on 05/23/23.     Assessment & Plan:    Routine Health Maintenance and Physical Exam  Immunization History  Administered Date(s) Administered   PPD Test 09/17/2021   Tdap 05/23/2023    Health Maintenance  Topic Date Due   Colonoscopy  05/01/2023   INFLUENZA VACCINE  09/08/2023 (Originally 01/09/2023)   Cervical Cancer Screening (HPV/Pap Cotest)  09/20/2027   DTaP/Tdap/Td (2 - Td or Tdap) 05/22/2033   Hepatitis C Screening  Completed   HIV Screening  Completed   HPV VACCINES  Aged Out   COVID-19 Vaccine  Discontinued    Discussed health benefits of physical activity, and encouraged her to engage in regular exercise appropriate for her age and condition.  Problem List Items Addressed This Visit       Endocrine   Thyroid nodule   Chronic, present on physical exam. Ultrasound in May 2024 identified thyroid nodule did not recommend routine follow-up with imaging.  Patient reports nodule has remained stable in size and is not causing any discomfort or dysphagia.  Patient to monitor closely and to reach out if she experiences any dysphagia, increase  in size of the nodule.      Relevant Orders   CBC   Comprehensive metabolic panel   Hemoglobin A1c   Lipid panel   TSH   T3, free   T4, free   Anti-TPO Ab (RDL)   VITAMIN D 25 Hydroxy (Vit-D Deficiency, Fractures)     Other   Colon cancer screening   Due for colon cancer screening. Patient would like to go to a different group than she did last time colonoscopy was completed. Referral to [facility] made today.      Relevant Orders   Ambulatory referral to Gastroenterology   CBC   Comprehensive metabolic panel   Hemoglobin A1c   Lipid panel   TSH   T3, free   T4, free    Anti-TPO Ab (RDL)   VITAMIN D 25 Hydroxy (Vit-D Deficiency, Fractures)   Need for vaccination   Tdap administered, VIS provided.      Relevant Orders   Tdap vaccine greater than or equal to 7yo IM (Completed)   Vitamin D deficiency   Chronic Continues on supplementation. Check serum vitamin D level, further conditions may be made based on the results.      Relevant Orders   CBC   Comprehensive metabolic panel   Hemoglobin A1c   Lipid panel   TSH   T3, free   T4, free   Anti-TPO Ab (RDL)   VITAMIN D 25 Hydroxy (Vit-D Deficiency, Fractures)   Encounter for general adult medical examination with abnormal findings - Primary   Patient is up-to-date with mammogram and Pap smear.  Due for tetanus vaccine.  Has completed hep C screening and STI screening in the past.  Will administer Tdap, VIS provided.  Discussed routine screening recommendations, handout provided.      Relevant Orders   CBC   Comprehensive metabolic panel   Hemoglobin A1c   Lipid panel   TSH   T3, free   T4, free   Anti-TPO Ab (RDL)   VITAMIN D 25 Hydroxy (Vit-D Deficiency, Fractures)   Return in about 5 months (around 10/21/2023) for F/U with Melvyn Hommes.     Elenore Paddy, NP

## 2023-05-23 NOTE — Assessment & Plan Note (Signed)
Patient is up-to-date with mammogram and Pap smear.  Due for tetanus vaccine.  Has completed hep C screening and STI screening in the past.  Will administer Tdap, VIS provided.  Discussed routine screening recommendations, handout provided.

## 2023-05-23 NOTE — Assessment & Plan Note (Signed)
Tdap administered, VIS provided.

## 2023-05-23 NOTE — Assessment & Plan Note (Signed)
Chronic Continues on supplementation. Check serum vitamin D level, further conditions may be made based on the results.

## 2023-05-31 LAB — ANTI-TPO AB (RDL): Anti-TPO Ab (RDL): 41.7 [IU]/mL — ABNORMAL HIGH (ref <9.0)

## 2023-06-05 ENCOUNTER — Other Ambulatory Visit: Payer: Self-pay | Admitting: Nurse Practitioner

## 2023-06-05 DIAGNOSIS — R768 Other specified abnormal immunological findings in serum: Secondary | ICD-10-CM

## 2023-06-05 DIAGNOSIS — E041 Nontoxic single thyroid nodule: Secondary | ICD-10-CM

## 2023-06-06 ENCOUNTER — Ambulatory Visit (INDEPENDENT_AMBULATORY_CARE_PROVIDER_SITE_OTHER): Payer: Medicaid Other | Admitting: Obstetrics

## 2023-06-06 ENCOUNTER — Encounter: Payer: Self-pay | Admitting: Obstetrics

## 2023-06-06 VITALS — BP 148/87 | HR 105 | Ht 64.84 in | Wt 208.6 lb

## 2023-06-06 DIAGNOSIS — R351 Nocturia: Secondary | ICD-10-CM | POA: Insufficient documentation

## 2023-06-06 DIAGNOSIS — K5904 Chronic idiopathic constipation: Secondary | ICD-10-CM | POA: Diagnosis not present

## 2023-06-06 DIAGNOSIS — R102 Pelvic and perineal pain: Secondary | ICD-10-CM | POA: Insufficient documentation

## 2023-06-06 DIAGNOSIS — N3946 Mixed incontinence: Secondary | ICD-10-CM

## 2023-06-06 MED ORDER — GEMTESA 75 MG PO TABS: 75.0000 mg | ORAL_TABLET | Freq: Every day | ORAL | 2 refills | Status: DC

## 2023-06-06 MED ORDER — GEMTESA 75 MG PO TABS: 75.0000 mg | ORAL_TABLET | Freq: Every day | ORAL | Status: DC

## 2023-06-06 NOTE — Patient Instructions (Addendum)
Mixed Incontinence (MUI):  MUI includes symptoms of both Overactive bladder (OAB) and stress incontinence (SUI).    Overactive bladder (OAB) causes bladder urgency, frequency, and having to void at night with or without leakage.  Several  treatment options exist, including behavioral changes (avoiding caffeine, etc), physical therapy, medications, and neuromodulation (ways to change the nerve signals to the bladder).   Stress incontinence (SUI) causes urinary leakage with coughing, laughing, sneezing and occasionally during exercise or bending/lifting.  Treatment options include nonsurgical options such as Kegel (pelvic floor) exercises, physical therapy, pessary (vaginal device similar to a diaphragm to prevent leakage) or surgical options such as a midurethral sling.   We discussed the symptoms of overactive bladder (OAB), which include urinary urgency, urinary frequency, night-time urination, with or without urge incontinence.  We discussed management including behavioral therapy (decreasing bladder irritants by following a bladder diet, urge suppression strategies, timed voids, bladder retraining), physical therapy, medication; and for refractory cases posterior tibial nerve stimulation, sacral neuromodulation, and intravesical botulinum toxin injection.   For Beta-3 agonist medication, we discussed the potential side effect of elevated blood pressure which is more likely to occur in individuals with uncontrolled hypertension. You were given samples for Gemtesa 75 mg.  It can take a month to start working so give it time, but if you have bothersome side effects call sooner and we can try a different medication.  Call us if you have trouble filling the prescription or if it's not covered by your insurance.  Constipation: Our goal is to achieve formed bowel movements daily or every-other-day.  You may need to try different combinations of the following options to find what works best for you -  everybody's body works differently so feel free to adjust the dosages as needed.  Some options to help maintain bowel health include:  Dietary changes (more leafy greens, vegetables and fruits; less processed foods) Fiber supplementation (Benefiber, FiberCon, Metamucil or Psyllium). Start slow and increase gradually to full dose. Over-the-counter agents such as: stool softeners (Docusate or Colace) and/or laxatives (Miralax, milk of magnesia)  "Power Pudding" is a natural mixture that may help your constipation.  To make blend 1 cup applesauce, 1 cup wheat bran, and 3/4 cup prune juice, refrigerate and then take 1 tablespoon daily with a large glass of water as needed.   Women should try to eat at least 21 to 25 grams of fiber a day, while men should aim for 30 to 38 grams a day. You can add fiber to your diet with food or a fiber supplement such as psyllium (metamucil), benefiber, or fibercon.   Here's a look at how much dietary fiber is found in some common foods. When buying packaged foods, check the Nutrition Facts label for fiber content. It can vary among brands.  Fruits Serving size Total fiber (grams)*  Raspberries 1 cup 8.0  Pear 1 medium 5.5  Apple, with skin 1 medium 4.5  Banana 1 medium 3.0  Orange 1 medium 3.0  Strawberries 1 cup 3.0   Vegetables Serving size Total fiber (grams)*  Green peas, boiled 1 cup 9.0  Broccoli, boiled 1 cup chopped 5.0  Turnip greens, boiled 1 cup 5.0  Brussels sprouts, boiled 1 cup 4.0  Potato, with skin, baked 1 medium 4.0  Sweet corn, boiled 1 cup 3.5  Cauliflower, raw 1 cup chopped 2.0  Carrot, raw 1 medium 1.5   Grains Serving size Total fiber (grams)*  Spaghetti, whole-wheat, cooked 1 cup 6.0  Barley, pearled,  cooked 1 cup 6.0  Bran flakes 3/4 cup 5.5  Quinoa, cooked 1 cup 5.0  Oat bran muffin 1 medium 5.0  Oatmeal, instant, cooked 1 cup 5.0  Popcorn, air-popped 3 cups 3.5  Brown rice, cooked 1 cup 3.5  Bread, whole-wheat 1 slice 2.0   Bread, rye 1 slice 2.0   Legumes, nuts and seeds Serving size Total fiber (grams)*  Split peas, boiled 1 cup 16.0  Lentils, boiled 1 cup 15.5  Black beans, boiled 1 cup 15.0  Baked beans, canned 1 cup 10.0  Chia seeds 1 ounce 10.0  Almonds 1 ounce (23 nuts) 3.5  Pistachios 1 ounce (49 nuts) 3.0  Sunflower kernels 1 ounce 3.0  *Rounded to nearest 0.5 gram. Source: [employer] for Standard Reference, Legacy Release    The origin of pelvic floor muscle spasm can be multifactorial, including primary, reactive to a different pain source, trauma, or even part of a centralized pain syndrome.Treatment options include pelvic floor physical therapy, local (vaginal) or oral  muscle relaxants, pelvic muscle trigger point injections or centrally acting pain medications.     Sleep with a pillow between your legs  For night time frequency: - avoid fluid intake after 6pm - elevated your feet during the day or use compression socks to reduce lower extremity swelling
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
## 2023-06-06 NOTE — Assessment & Plan Note (Signed)
-   avoid fluid intake after 6pm - elevated feet during the day or use compression socks to reduce lower extremity swelling - consider workup if refractory due to history of snoring - encouraged trial of Gemtesa

## 2023-06-06 NOTE — Assessment & Plan Note (Signed)
-   For constipation, we reviewed the importance of a better bowel regimen.  We also discussed the importance of avoiding chronic straining, as it can exacerbate her pelvic floor symptoms; we discussed treating constipation and straining prior to surgery, as postoperative straining can lead to damage to the repair and recurrence of symptoms. We discussed initiating therapy with increasing fluid intake, fiber supplementation, stool softeners, and laxatives such as miralax.  - discussed association with urinary leakage symptoms
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
## 2023-06-06 NOTE — Assessment & Plan Note (Addendum)
-   most bothered by urgency urinary leakage - will need UA at follow-up - bladder scan 12mL WNL - We discussed the symptoms of overactive bladder (OAB), which include urinary urgency, urinary frequency, nocturia, with or without urge incontinence.  While we do not know the exact etiology of OAB, several treatment options exist. We discussed management including behavioral therapy (decreasing bladder irritants, urge suppression strategies, timed voids, bladder retraining), physical therapy, medication; for refractory cases posterior tibial nerve stimulation, sacral neuromodulation, and intravesical botulinum toxin injection.  For anticholinergic medications, we discussed the potential side effects of anticholinergics including dry eyes, dry mouth, constipation, cognitive impairment and urinary retention. For Beta-3 agonist medication, we discussed the potential side effect of elevated blood pressure which is more likely to occur in individuals with uncontrolled hypertension. - encouraged fluid management, Kegel exercises, caffeine reduction - samples and Rx for Gemtesa if refractory symptoms - For treatment of stress urinary incontinence,  non-surgical options include expectant management, weight loss, physical therapy, as well as a pessary.  Surgical options include a midurethral sling, Burch urethropexy, and transurethral injection of a bulking agent.

## 2023-07-11 ENCOUNTER — Ambulatory Visit: Payer: Medicaid Other

## 2023-07-18 ENCOUNTER — Ambulatory Visit: Payer: BC Managed Care – PPO

## 2023-07-18 VITALS — BP 142/93 | HR 68 | Wt 215.3 lb

## 2023-07-18 DIAGNOSIS — E041 Nontoxic single thyroid nodule: Secondary | ICD-10-CM | POA: Diagnosis not present

## 2023-07-18 DIAGNOSIS — Z3042 Encounter for surveillance of injectable contraceptive: Secondary | ICD-10-CM

## 2023-07-18 DIAGNOSIS — E063 Autoimmune thyroiditis: Secondary | ICD-10-CM | POA: Diagnosis not present

## 2023-07-18 MED ORDER — MEDROXYPROGESTERONE ACETATE 150 MG/ML IM SUSY
150.0000 mg | PREFILLED_SYRINGE | Freq: Once | INTRAMUSCULAR | Status: AC
  Administered 2023-07-18: 150 mg via INTRAMUSCULAR

## 2023-07-18 NOTE — Addendum Note (Signed)
 Addended by: Fonda Hymen on: 07/18/2023 08:41 AM   Modules accepted: Level of Service

## 2023-07-18 NOTE — Progress Notes (Signed)
 Tammy Scott here for Depo-Provera  Injection. Injection administered without complication. Patient will return in 3 months for next injection between 10/03/23 and 10/17/23. Next annual visit due 02/03/24.  BP elevated today x 2. Pt will notify primary care provider.  Vernell FORBES Ruddle, RN 07/18/2023  8:12 AM

## 2023-07-22 ENCOUNTER — Encounter: Payer: Self-pay | Admitting: Gastroenterology

## 2023-08-18 ENCOUNTER — Ambulatory Visit (AMBULATORY_SURGERY_CENTER): Payer: BC Managed Care – PPO

## 2023-08-18 VITALS — Ht 64.0 in | Wt 210.0 lb

## 2023-08-18 DIAGNOSIS — Z1211 Encounter for screening for malignant neoplasm of colon: Secondary | ICD-10-CM

## 2023-08-18 MED ORDER — NA SULFATE-K SULFATE-MG SULF 17.5-3.13-1.6 GM/177ML PO SOLN: 1.0000 | Freq: Once | ORAL | 0 refills | Status: AC

## 2023-08-18 NOTE — Progress Notes (Signed)

## 2023-08-22 ENCOUNTER — Ambulatory Visit
Admission: RE | Admit: 2023-08-22 | Discharge: 2023-08-22 | Disposition: A | Discharge status: Home / Self Care | Payer: Medicaid Other | Source: Ambulatory Visit | Attending: Orthopedic Surgery | Admitting: Orthopedic Surgery

## 2023-08-22 DIAGNOSIS — N958 Other specified menopausal and perimenopausal disorders: Secondary | ICD-10-CM | POA: Diagnosis not present

## 2023-08-22 DIAGNOSIS — Z780 Asymptomatic menopausal state: Secondary | ICD-10-CM

## 2023-09-01 ENCOUNTER — Encounter: Payer: Self-pay | Admitting: Certified Registered Nurse Anesthetist

## 2023-09-04 ENCOUNTER — Encounter: Payer: Self-pay | Admitting: Gastroenterology

## 2023-09-05 ENCOUNTER — Encounter: Payer: Self-pay | Admitting: Gastroenterology

## 2023-09-05 ENCOUNTER — Ambulatory Visit: Payer: BC Managed Care – PPO | Admitting: Gastroenterology

## 2023-09-05 VITALS — BP 153/97 | HR 67 | Temp 98.2°F | Resp 13 | Ht 64.0 in | Wt 210.0 lb

## 2023-09-05 DIAGNOSIS — K648 Other hemorrhoids: Secondary | ICD-10-CM | POA: Diagnosis not present

## 2023-09-05 DIAGNOSIS — Z1211 Encounter for screening for malignant neoplasm of colon: Secondary | ICD-10-CM

## 2023-09-05 DIAGNOSIS — K635 Polyp of colon: Secondary | ICD-10-CM

## 2023-09-05 DIAGNOSIS — D127 Benign neoplasm of rectosigmoid junction: Secondary | ICD-10-CM

## 2023-09-05 DIAGNOSIS — K5730 Diverticulosis of large intestine without perforation or abscess without bleeding: Secondary | ICD-10-CM

## 2023-09-05 DIAGNOSIS — D123 Benign neoplasm of transverse colon: Secondary | ICD-10-CM

## 2023-09-05 MED ORDER — SODIUM CHLORIDE 0.9 % IV SOLN: 500.0000 mL | Freq: Once | INTRAVENOUS | Status: DC

## 2023-09-05 NOTE — Progress Notes (Signed)
 Report given to PACU, vss

## 2023-09-05 NOTE — Progress Notes (Signed)
 Pt's states no medical or surgical changes since previsit or office visit.

## 2023-09-05 NOTE — Op Note (Signed)
 [facility] Patient Name: Tammy Scott Procedure Date: 09/05/2023 10:39 AM MRN: [id number] Endoscopist: Viviann Spare P. Adela Lank , MD, [id number] Age: 48 Referring MD:  Date of Birth: 04-22-1976 Gender: Female Account #: [id number] Procedure:                Colonoscopy Indications:              Screening for colorectal malignant neoplasm Medicines:                Monitored Anesthesia Care Procedure:                Pre-Anesthesia Assessment:                           - Prior to the procedure, a History and Physical                            was performed, and patient medications and                            allergies were reviewed. The patient's tolerance of                            previous anesthesia was also reviewed. The risks                            and benefits of the procedure and the sedation                            options and risks were discussed with the patient.                            All questions were answered, and informed consent                            was obtained. Prior Anticoagulants: The patient has                            taken no anticoagulant or antiplatelet agents. ASA                            Grade Assessment: I - A normal, healthy patient.                            After reviewing the risks and benefits, the patient                            was deemed in satisfactory condition to undergo the                            procedure.                           After obtaining informed consent, the colonoscope  was passed under direct vision. Throughout the                            procedure, the patient's blood pressure, pulse, and                            oxygen saturations were monitored continuously. The                            Olympus Scope SN: [id number] was introduced through                            the anus and advanced to the the cecum, identified                            by appendiceal  orifice and ileocecal valve. The                            colonoscopy was performed without difficulty. The                            patient tolerated the procedure well. The quality                            of the bowel preparation was good. The ileocecal                            valve, appendiceal orifice, and rectum were                            photographed. Scope In: 10:43:31 AM Scope Out: 11:05:28 AM Scope Withdrawal Time: 0 hours 19 minutes 4 seconds  Total Procedure Duration: 0 hours 21 minutes 57 seconds  Findings:                 The perianal and digital rectal examinations were                            normal.                           Scattered diverticula were found in the left colon                            and right colon.                           A diminutive polyp was found in the splenic                            flexure. The polyp was sessile. The polyp was                            removed with a cold snare. Resection and retrieval  were complete.                           A 3 to 4 mm polyp was found in the recto-sigmoid                            colon. The polyp was sessile. The polyp was removed                            with a cold snare. Resection and retrieval were                            complete.                           Internal hemorrhoids were found during retroflexion.                           The exam was otherwise without abnormality. Complications:            No immediate complications. Estimated blood loss:                            Minimal. Estimated Blood Loss:     Estimated blood loss was minimal. Impression:               - Diverticulosis in the left colon and in the right                            colon.                           - One diminutive polyp at the splenic flexure,                            removed with a cold snare. Resected and retrieved.                           - One 3 to 4 mm  polyp at the recto-sigmoid colon,                            removed with a cold snare. Resected and retrieved.                           - Internal hemorrhoids.                           - The examination was otherwise normal. Recommendation:           - Patient has a contact number available for                            emergencies. The signs and symptoms of potential                            delayed complications were discussed  with the                            patient. Return to normal activities tomorrow.                            Written discharge instructions were provided to the                            patient.                           - Resume previous diet.                           - Continue present medications.                           - Await pathology results. Viviann Spare P. Sydna Brodowski, MD 09/05/2023 11:10:17 AM This report has been signed electronically.

## 2023-09-05 NOTE — Progress Notes (Signed)
 Called to room to assist during endoscopic procedure.  Patient ID and intended procedure confirmed with present staff. Received instructions for my participation in the procedure from the performing physician.

## 2023-09-05 NOTE — Patient Instructions (Addendum)
**  Handouts given on polyps, hemorrhoids and diverticulosis**   - Resume previous diet - Continue present medications. - Await pathology results   YOU HAD AN ENDOSCOPIC PROCEDURE TODAY AT THE [facility]:   Refer to the procedure report that was given to you for any specific questions about what was found during the examination.  If the procedure report does not answer your questions, please call your gastroenterologist to clarify.  If you requested that your care partner not be given the details of your procedure findings, then the procedure report has been included in a sealed envelope for you to review at your convenience later.  YOU SHOULD EXPECT: Some feelings of bloating in the abdomen. Passage of more gas than usual.  Walking can help get rid of the air that was put into your GI tract during the procedure and reduce the bloating. If you had a lower endoscopy (such as a colonoscopy or flexible sigmoidoscopy) you may notice spotting of blood in your stool or on the toilet paper. If you underwent a bowel prep for your procedure, you may not have a normal bowel movement for a few days.  Please Note:  You might notice some irritation and congestion in your nose or some drainage.  This is from the oxygen used during your procedure.  There is no need for concern and it should clear up in a day or so.  SYMPTOMS TO REPORT IMMEDIATELY:  Following lower endoscopy (colonoscopy or flexible sigmoidoscopy):  Excessive amounts of blood in the stool  Significant tenderness or worsening of abdominal pains  Swelling of the abdomen that is new, acute  Fever of 100F or higher  For urgent or emergent issues, a gastroenterologist can be reached at any hour by calling [phone]. Do not use MyChart messaging for urgent concerns.    DIET:  We do recommend a small meal at first, but then you may proceed to your regular diet.  Drink plenty of fluids but you should avoid alcoholic beverages for  24 hours.  ACTIVITY:  You should plan to take it easy for the rest of today and you should NOT DRIVE or use heavy machinery until tomorrow (because of the sedation medicines used during the test).    FOLLOW UP: Our staff will call the number listed on your records the next business day following your procedure.  We will call around 7:15- 8:00 am to check on you and address any questions or concerns that you may have regarding the information given to you following your procedure. If we do not reach you, we will leave a message.     If any biopsies were taken you will be contacted by phone or by letter within the next 1-3 weeks.  Please call us at [phone] if you have not heard about the biopsies in 3 weeks.    SIGNATURES/CONFIDENTIALITY: You and/or your care partner have signed paperwork which will be entered into your electronic medical record.  These signatures attest to the fact that that the information above on your After Visit Summary has been reviewed and is understood.  Full responsibility of the confidentiality of this discharge information lies with you and/or your care-partner.

## 2023-09-05 NOTE — Progress Notes (Signed)
  Gastroenterology History and Physical   Primary Care Physician:  Elenore Paddy, NP   Reason for Procedure:   Colon cancer screening  Plan:    colonoscopy     HPI: Tammy Scott is a 48 y.o. female  here for colonoscopy screening.   Patient denies any bowel symptoms at this time. No family history of colon cancer known. Otherwise feels well without any cardiopulmonary symptoms.   I have discussed risks / benefits of anesthesia and endoscopic procedure with Colin Mulders and they wish to proceed with the exams as outlined today.    Past Medical History:  Diagnosis Date   Abnormal pap    Chlamydia    History of gonorrhea    Thyroid nodule 09/20/2022   Vaginal Pap smear, abnormal     Past Surgical History:  Procedure Laterality Date   COLONOSCOPY N/A 04/30/2013   Procedure: COLONOSCOPY;  Surgeon: West Bali, MD;  [facility];  Service: Endoscopy;  Laterality: N/A;  1:15   COLONOSCOPY     Colyposcopy     WISDOM TOOTH EXTRACTION      Prior to Admission medications   Medication Sig Start Date End Date Taking? Authorizing Provider  cholecalciferol (VITAMIN D) 1000 UNITS tablet Take 1,000 Units by mouth daily.   Yes [provider]  Fexofenadine HCl (ALLEGRA PO) Take by mouth as needed.   Yes [provider]  Multiple Vitamin (MULTIVITAMIN) tablet Take 1 tablet by mouth daily.   Yes [provider]  acetaminophen (TYLENOL) 500 MG tablet Take 1,000 mg by mouth every 6 (six) hours as needed for moderate pain (pain score 4-6) or headache.    [provider]  naproxen (NAPROSYN) 500 MG tablet Take 500 mg by mouth 2 (two) times daily with a meal. PRN Patient not taking: Reported on 07/18/2023    [provider]  Vibegron (GEMTESA) 75 MG TABS Take 1 tablet (75 mg total) by mouth daily. Patient not taking: Reported on 07/18/2023 06/06/23   Loleta Chance, MD    Current Outpatient Medications  Medication Sig  Dispense Refill   cholecalciferol (VITAMIN D) 1000 UNITS tablet Take 1,000 Units by mouth daily.     Fexofenadine HCl (ALLEGRA PO) Take by mouth as needed.     Multiple Vitamin (MULTIVITAMIN) tablet Take 1 tablet by mouth daily.     acetaminophen (TYLENOL) 500 MG tablet Take 1,000 mg by mouth every 6 (six) hours as needed for moderate pain (pain score 4-6) or headache.     naproxen (NAPROSYN) 500 MG tablet Take 500 mg by mouth 2 (two) times daily with a meal. PRN (Patient not taking: Reported on 07/18/2023)     Vibegron (GEMTESA) 75 MG TABS Take 1 tablet (75 mg total) by mouth daily. (Patient not taking: Reported on 07/18/2023) 30 tablet 2   Current Facility-Administered Medications  Medication Dose Route Frequency Provider Last Rate Last Admin   0.9 %  sodium chloride infusion  500 mL Intravenous Once Giancarlos Berendt, Willaim Rayas, MD        Allergies as of 09/05/2023   (No Known Allergies)    Family History  Problem Relation Age of Onset   Cancer Mother 87       breast   Colon polyps Mother    Colon cancer Neg Hx    Rectal cancer Neg Hx    Stomach cancer Neg Hx    Esophageal cancer Neg Hx     Social History  Socioeconomic History   Marital status: Single    Spouse name: Not on file   Number of children: Not on file   Years of education: Not on file   Highest education level: Some college, no degree  Occupational History   Occupation: full-time Consulting civil engineer   Occupation: Ship broker  Tobacco Use   Smoking status: Former    Current packs/day: 0.00    Average packs/day: 1 pack/day for 3.0 years (3.0 ttl pk-yrs)    Types: Cigarettes    Start date: 06/10/2002    Quit date: 06/10/2005    Years since quitting: 18.2   Smokeless tobacco: Never  Vaping Use   Vaping status: Never Used  Substance and Sexual Activity   Alcohol use: Yes    Comment: socially   Drug use: Yes    Frequency: 3.0 times per week    Types: Marijuana    Comment: last used marijuana 09-03-23   Sexual activity: Yes     Partners: Male    Birth control/protection: Condom, Injection  Other Topics Concern   Not on file  Social History Narrative   Not on file   Social Drivers of Health   Financial Resource Strain: Medium Risk (02/03/2023)   Overall Financial Resource Strain (CARDIA)    Difficulty of Paying Living Expenses: Somewhat hard  Food Insecurity: Food Insecurity Present (02/03/2023)   Hunger Vital Sign    Worried About Running Out of Food in the Last Year: Sometimes true    Ran Out of Food in the Last Year: Sometimes true  Transportation Needs: Unmet Transportation Needs (02/03/2023)   PRAPARE - Administrator, Civil Service (Medical): Yes    Lack of Transportation (Non-Medical): No  Physical Activity: Unknown (02/03/2023)   Exercise Vital Sign    Days of Exercise per Week: 0 days    Minutes of Exercise per Session: Not on file  Stress: Stress Concern Present (02/03/2023)   [employer] of Occupational Health - Occupational Stress Questionnaire    Feeling of Stress : To some extent  Social Connections: Moderately Isolated (02/03/2023)   Social Connection and Isolation Panel [NHANES]    Frequency of Communication with Friends and Family: Three times a week    Frequency of Social Gatherings with Friends and Family: Once a week    Attends Religious Services: More than 4 times per year    Active Member of [employer] or Organizations: No    Attends Engineer, structural: Not on file    Marital Status: Never married  Intimate Partner Violence: Not on file    Review of Systems: All other review of systems negative except as mentioned in the HPI.  Physical Exam: Vital signs BP 120/85   Pulse 70   Temp 98.2 F (36.8 C)   Ht 5\' 4"  (1.626 m)   Wt 210 lb (95.3 kg)   SpO2 98%   BMI 36.05 kg/m   General:   Alert,  Well-developed, pleasant and cooperative in NAD Lungs:  Clear throughout to auscultation.   Heart:  Regular rate and rhythm Abdomen:  Soft, nontender and  nondistended.   Neuro/Psych:  Alert and cooperative. Normal mood and affect. A and O x 3  Harlin Rain, MD [facility]

## 2023-09-08 ENCOUNTER — Telehealth: Payer: Self-pay | Admitting: *Deleted

## 2023-09-08 NOTE — Telephone Encounter (Signed)
  Follow up Call-     09/05/2023   10:22 AM  Call back number  Post procedure Call Back phone  # [phone]  Permission to leave phone message Yes     Patient questions:  Do you have a fever, pain , or abdominal swelling? No. Pain Score  0 *  Have you tolerated food without any problems? Yes.    Have you been able to return to your normal activities? Yes.    Do you have any questions about your discharge instructions: Diet   No. Medications  No. Follow up visit  No.  Do you have questions or concerns about your Care? No.  Actions: * If pain score is 4 or above: No action needed, pain <4.

## 2023-09-09 LAB — SURGICAL PATHOLOGY

## 2023-09-15 ENCOUNTER — Encounter: Payer: Self-pay | Admitting: Gastroenterology

## 2023-09-19 ENCOUNTER — Ambulatory Visit: Payer: BC Managed Care – PPO | Admitting: Nurse Practitioner

## 2023-09-19 VITALS — BP 138/92 | HR 70 | Temp 98.7°F | Ht 64.0 in | Wt 220.0 lb

## 2023-09-19 DIAGNOSIS — N3946 Mixed incontinence: Secondary | ICD-10-CM | POA: Diagnosis not present

## 2023-09-19 DIAGNOSIS — J309 Allergic rhinitis, unspecified: Secondary | ICD-10-CM | POA: Diagnosis not present

## 2023-09-19 DIAGNOSIS — H93293 Other abnormal auditory perceptions, bilateral: Secondary | ICD-10-CM | POA: Diagnosis not present

## 2023-09-19 DIAGNOSIS — M26623 Arthralgia of bilateral temporomandibular joint: Secondary | ICD-10-CM | POA: Diagnosis not present

## 2023-09-19 DIAGNOSIS — M545 Low back pain, unspecified: Secondary | ICD-10-CM | POA: Diagnosis not present

## 2023-09-19 DIAGNOSIS — R030 Elevated blood-pressure reading, without diagnosis of hypertension: Secondary | ICD-10-CM

## 2023-09-19 DIAGNOSIS — G4700 Insomnia, unspecified: Secondary | ICD-10-CM

## 2023-09-19 DIAGNOSIS — R0982 Postnasal drip: Secondary | ICD-10-CM | POA: Diagnosis not present

## 2023-09-19 NOTE — Progress Notes (Signed)
 Established Patient Office Visit  Subjective   Patient ID: Tammy Scott, female    DOB: 1975-07-28  Age: 48 y.o. MRN: [id number]  Chief Complaint  Patient presents with   Insomnia    Insomnia: Reports no difficulty falling asleep but frequently wakes up at night.  Sometimes will have daytime naps.  Has tried melatonin in the past but not recently.  Has stopped using screens when waking up overnight to try to help her with falling back asleep.  Often awake for 1-1.5 hours before able to fall back asleep.  Denies snoring.  Mixed urinary incontinence: Is following with OB/GYN for this.  Has been intermittently practicing exercises which sometimes do help with the incontinence.  Has not been able to go to physical therapy, was prescribed intensive but patient prefers not to take medication so she never took it.  Elevated blood pressure: Noted on the last few blood pressure readings.  Patient is not currently on antihypertensive.  Low back pain: Chronic, intermittent.  Usually occurs more on the left side sometimes does have associated radiculopathy.  Today no pain reported, no sensory changes, no weakness.  Patient does report she had an episode of weakness in the past but this is now resolved.    ROS: see HPI    Objective:     BP (!) 138/92   Pulse 70   Temp 98.7 F (37.1 C) (Temporal)   Ht 5\' 4"  (1.626 m)   Wt 220 lb (99.8 kg)   SpO2 97%   BMI 37.76 kg/m  BP Readings from Last 3 Encounters:  09/19/23 (!) 138/92  09/05/23 (!) 153/97  07/18/23 (!) 142/93   Wt Readings from Last 3 Encounters:  09/19/23 220 lb (99.8 kg)  09/05/23 210 lb (95.3 kg)  08/18/23 210 lb (95.3 kg)      Physical Exam Vitals reviewed.  Constitutional:      General: She is not in acute distress.    Appearance: Normal appearance.  HENT:     Head: Normocephalic and atraumatic.  Neck:     Vascular: No carotid bruit.  Cardiovascular:     Rate and Rhythm: Normal rate and regular rhythm.      Pulses: Normal pulses.     Heart sounds: Normal heart sounds.  Pulmonary:     Effort: Pulmonary effort is normal.     Breath sounds: Normal breath sounds.  Skin:    General: Skin is warm and dry.  Neurological:     General: No focal deficit present.     Mental Status: She is alert and oriented to person, place, and time.  Psychiatric:        Mood and Affect: Mood normal.        Behavior: Behavior normal.        Judgment: Judgment normal.      No results found for any visits on 09/19/23.    The 10-year ASCVD risk score (Arnett DK, et al., 2019) is: 0.7%    Assessment & Plan:   Problem List Items Addressed This Visit       Other   Mixed incontinence - Primary   Chronic Patient encouraged to continue following up with OB/GYN, also encouraged to let me know if she would like to move forward with physical therapy for pelvic floor training. Patient reports understanding.      Low back pain without sciatica   Chronic, intermittent No red flag signs or symptoms today. She was told if she experiences another  flareup especially if it is associate with sensory changes or weakness in her legs she should notify me at which point may consider imaging study.  She reports understanding. In the meantime encouraged use of as needed anti-inflammatories, Tylenol, or over-the-counter topical treatments.      Elevated blood pressure reading   We again discussed blood pressure and the importance of maintaining normal blood pressure.  She would prefer not to take medication if possible.  She is agreeable to checking blood pressure 1-2 halves per week at home and sending me blood pressure log in about 1 month.  Further recommendations to be made based upon those readings.      Insomnia   Chronic Sleep hygiene techniques discussed. Offered referral to sleep medicine for patient to be screened for sleep apnea, patient declines. Patient may consider trying melatonin over-the-counter but does  not want tried prescription therapy at this time. Patient courage to reach out with any concerns or questions.       Return in about 8 months (around 05/20/2024) for CPE with Maralyn Sago.    Elenore Paddy, NP

## 2023-09-19 NOTE — Assessment & Plan Note (Signed)
 Chronic Sleep hygiene techniques discussed. Offered referral to sleep medicine for patient to be screened for sleep apnea, patient declines. Patient may consider trying melatonin over-the-counter but does not want tried prescription therapy at this time. Patient courage to reach out with any concerns or questions.

## 2023-09-19 NOTE — Assessment & Plan Note (Signed)
 Chronic Patient encouraged to continue following up with OB/GYN, also encouraged to let me know if she would like to move forward with physical therapy for pelvic floor training. Patient reports understanding.

## 2023-09-19 NOTE — Assessment & Plan Note (Signed)
 Chronic, intermittent No red flag signs or symptoms today. She was told if she experiences another flareup especially if it is associate with sensory changes or weakness in her legs she should notify me at which point may consider imaging study.  She reports understanding. In the meantime encouraged use of as needed anti-inflammatories, Tylenol, or over-the-counter topical treatments.

## 2023-09-19 NOTE — Patient Instructions (Signed)
 Goal blood pressure < 130/80.

## 2023-09-19 NOTE — Assessment & Plan Note (Signed)
 We again discussed blood pressure and the importance of maintaining normal blood pressure.  She would prefer not to take medication if possible.  She is agreeable to checking blood pressure 1-2 halves per week at home and sending me blood pressure log in about 1 month.  Further recommendations to be made based upon those readings.

## 2023-10-03 ENCOUNTER — Ambulatory Visit: Payer: BC Managed Care – PPO

## 2023-10-03 ENCOUNTER — Other Ambulatory Visit: Payer: Self-pay

## 2023-10-03 VITALS — BP 148/98 | HR 67 | Ht 64.0 in | Wt 221.0 lb

## 2023-10-03 DIAGNOSIS — Z3042 Encounter for surveillance of injectable contraceptive: Secondary | ICD-10-CM | POA: Diagnosis not present

## 2023-10-03 MED ORDER — MEDROXYPROGESTERONE ACETATE 150 MG/ML IM SUSY
150.0000 mg | PREFILLED_SYRINGE | Freq: Once | INTRAMUSCULAR | Status: AC
  Administered 2023-10-03: 150 mg via INTRAMUSCULAR

## 2023-10-03 NOTE — Progress Notes (Signed)
 Tammy Scott here for Depo-Provera  Injection. Last injection was on 07/18/23. Injection administered without complication. Patient will return in 3 months for next injection between July 11 and July 25. Next annual visit due 01/2024.   Patient had elevated BP x2. Per patient, she saw PCP on 09/19/23 and PCP wanted patient to have BP readings for 3 weeks. Pt is on her 2nd week recording her BP and PCP, Fredrick Jenkins,  NP to follow up with patient. Advised patient to contact PCP regarding BP readings. Patient verbalized understanding to contact their office to get appt scheduled.  Patient also reports having some intermittent leaking urine incontinence occasionally. Did go to [place]. Advised patient to contact their office for follow up. Patient verbalized understanding and  will schedule follow up appt.   Lennart Quitter, RN 10/03/2023  9:09 AM

## 2023-10-21 ENCOUNTER — Other Ambulatory Visit: Payer: Self-pay | Admitting: Nurse Practitioner

## 2023-10-21 DIAGNOSIS — E041 Nontoxic single thyroid nodule: Secondary | ICD-10-CM

## 2023-12-19 ENCOUNTER — Ambulatory Visit (INDEPENDENT_AMBULATORY_CARE_PROVIDER_SITE_OTHER): Admitting: *Deleted

## 2023-12-19 ENCOUNTER — Other Ambulatory Visit: Payer: Self-pay

## 2023-12-19 VITALS — BP 116/64 | HR 79 | Ht 64.0 in | Wt 227.1 lb

## 2023-12-19 DIAGNOSIS — Z3042 Encounter for surveillance of injectable contraceptive: Secondary | ICD-10-CM | POA: Diagnosis not present

## 2023-12-19 MED ORDER — MEDROXYPROGESTERONE ACETATE 150 MG/ML IM SUSP
150.0000 mg | Freq: Once | INTRAMUSCULAR | Status: AC
  Administered 2023-12-19: 150 mg via INTRAMUSCULAR

## 2023-12-19 NOTE — Progress Notes (Signed)
 Depo provera  150 mg IM administered as scheduled. Pt tolerated well. Next dose due 9/26-10/10. Pt was due for Annual Gyn exam 09/21/23. She was advised to schedule appt and agreed. Next pap due 09/2025.

## 2024-02-06 ENCOUNTER — Other Ambulatory Visit (HOSPITAL_COMMUNITY)
Admission: RE | Admit: 2024-02-06 | Discharge: 2024-02-06 | Disposition: A | Discharge status: Home / Self Care | Source: Ambulatory Visit | Attending: Family Medicine | Admitting: Family Medicine

## 2024-02-06 ENCOUNTER — Other Ambulatory Visit: Payer: Self-pay

## 2024-02-06 ENCOUNTER — Encounter: Payer: Self-pay | Admitting: Family Medicine

## 2024-02-06 ENCOUNTER — Ambulatory Visit: Admitting: Family Medicine

## 2024-02-06 VITALS — BP 145/83 | HR 75 | Wt 227.0 lb

## 2024-02-06 DIAGNOSIS — Z01419 Encounter for gynecological examination (general) (routine) without abnormal findings: Secondary | ICD-10-CM | POA: Diagnosis not present

## 2024-02-06 DIAGNOSIS — Z0000 Encounter for general adult medical examination without abnormal findings: Secondary | ICD-10-CM

## 2024-02-06 DIAGNOSIS — Z1231 Encounter for screening mammogram for malignant neoplasm of breast: Secondary | ICD-10-CM | POA: Diagnosis not present

## 2024-02-06 DIAGNOSIS — Z113 Encounter for screening for infections with a predominantly sexual mode of transmission: Secondary | ICD-10-CM

## 2024-02-06 DIAGNOSIS — Z1331 Encounter for screening for depression: Secondary | ICD-10-CM

## 2024-02-06 DIAGNOSIS — N3946 Mixed incontinence: Secondary | ICD-10-CM | POA: Diagnosis not present

## 2024-02-06 MED ORDER — OXYBUTYNIN CHLORIDE ER 10 MG PO TB24: 10.0000 mg | ORAL_TABLET | Freq: Every day | ORAL | 1 refills | Status: AC

## 2024-02-06 NOTE — Assessment & Plan Note (Signed)
 Primarily urge incontinence at present, discussed trial of ditropan  and strongly recommend calling Urogyn to get an appointment for ongoing follow up.

## 2024-02-06 NOTE — Assessment & Plan Note (Signed)
 Cancer screening: - Pap: ASCUS neg HPV last year, not yet due for 3 year repeat co testing. Discussed rationale in detail with patient, provided reassurance. - Mammogram: ordered today - Colonoscopy: last done 08/2023, f/w GI  Contraception: On depo, happy with method. Only been on it for 2 years. Recent bone scan was normal. Discussed not great for long term and she is well aware.   Mood: Garment/textile technologist Visit from 02/06/2024 in [facility] Office Visit from 09/19/2023 in [facility] Visit from 02/03/2023 in [facility]  Thoughts that you would be better off dead, or of hurting yourself in some way Not at all Not at all Not at all  PHQ-9 Total Score 12 9 6    Reports mostly related to stress around job, thinking about switching  Vaccinations: - Flu: n/a  Metabolic: - DM: up to date on screening w PCP - Lipids: up to date on screening w PCP  ID: - STI: swab collected, offered serologies

## 2024-02-06 NOTE — Patient Instructions (Signed)

## 2024-02-06 NOTE — Progress Notes (Signed)
 GYNECOLOGY OFFICE VISIT NOTE  History:   Tammy Scott is a 48 y.o. [id number] here today for annual wellness visit.  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 Sometimes has strong urges and doesn't make it to the bathroom in time Rare that she has leakage with coughing or laughing Has been referred to [facility] in the past but has not been able to get there due to transportation issues   Health Maintenance Due  Topic Date Due   Hepatitis B Vaccines 19-59 Average Risk (1 of 3 - 19+ 3-dose series) Never done   HPV VACCINES (1 - 3-dose SCDM series) Never done   INFLUENZA VACCINE  01/09/2024    Past Medical History:  Diagnosis Date   Abnormal pap    Chlamydia    History of gonorrhea    Thyroid  nodule 09/20/2022   Vaginal Pap smear, abnormal     Past Surgical History:  Procedure Laterality Date   COLONOSCOPY N/A 04/30/2013   Procedure: COLONOSCOPY;  Surgeon: Margo LITTIE Haddock, MD;  [facility];  Service: Endoscopy;  Laterality: N/A;  1:15   COLONOSCOPY     Colyposcopy     WISDOM TOOTH EXTRACTION      The following portions of the patient's history were reviewed and updated as appropriate: allergies, current medications, past family history, past medical history, past social history, past surgical history and problem list.   Health Maintenance:   Last pap: Result Date Procedure Results Follow-ups  09/20/2022 Cytology - PAP High risk HPV: Negative Adequacy: Satisfactory for evaluation; transformation zone component ABSENT. Diagnosis: - Atypical squamous cells of undetermined significance (ASC-US ) (A) Comment: Normal Reference Range HPV - Negative HPV testing in 3 years: due 09/19/2025 Pap in 3 years: due 09/19/2025  09/25/2015 Cytology - PAP CYTOLOGY - PAP: PAP RESULT   09/02/2011 [facility] PAP SMEAR      Last  mammogram:  10/29/2022 - BIRADS 1  Repeat ordered today  Review of Systems:  Pertinent items noted in HPI and remainder of comprehensive ROS otherwise negative.  Physical Exam:  BP (!) 147/88   Pulse 75   Wt 227 lb (103 kg)   BMI 38.96 kg/m  CONSTITUTIONAL: Well-developed, well-nourished female in no acute distress.  HEENT:  Normocephalic, atraumatic. External right and left ear normal. No scleral icterus.  NECK: Normal range of motion, supple, no masses noted on observation SKIN: No rash noted. Not diaphoretic. No erythema. No pallor. BREAST: unremarkable exam, no masses, no axillary adenopathy MUSCULOSKELETAL: Normal range of motion. No edema noted. NEUROLOGIC: Alert and oriented to person, place, and time. Normal muscle tone coordination.  PSYCHIATRIC: Normal mood and affect. Normal behavior. Normal judgment and thought content. RESPIRATORY: Effort normal, no problems with respiration noted ABDOMEN: No masses noted. No other overt distention noted.   PELVIC: Deferred  Labs and Imaging No results found for this or any previous visit (from the past week). No results found.    Assessment and Plan:   Problem List Items Addressed This Visit       Other   Mixed incontinence   Primarily urge incontinence at present, discussed trial of ditropan  and strongly recommend calling Urogyn to get an appointment for ongoing follow up.       Relevant Medications   oxybutynin  (DITROPAN  XL) 10 MG 24 hr tablet  Well woman exam   Cancer screening: - Pap: ASCUS neg HPV last year, not yet due for 3 year repeat co testing. Discussed rationale in detail with patient, provided reassurance. - Mammogram: ordered today - Colonoscopy: last done 08/2023, f/w GI  Contraception: On depo, happy with method. Only been on it for 2 years. Recent bone scan was normal. Discussed not great for long term and she is well aware.   Mood: Garment/textile technologist Visit from 02/06/2024 in [facility] for [employer] [facility] Office Visit from 09/19/2023 in [facility] Visit from 02/03/2023 in [facility]  Thoughts that you would be better off dead, or of hurting yourself in some way Not at all Not at all Not at all  PHQ-9 Total Score 12 9 6    Reports mostly related to stress around job, thinking about switching  Vaccinations: - Flu: n/a  Metabolic: - DM: up to date on screening w PCP - Lipids: up to date on screening w PCP  ID: - STI: swab collected, offered serologies      Other Visit Diagnoses       Annual wellness visit    -  Primary     Encounter for screening examination for sexually transmitted infection       Relevant Orders   RPR+HBsAg+HCVAb+...   Cervicovaginal ancillary only( [place])     Encounter for screening mammogram for malignant neoplasm of breast       Relevant Orders   MM 3D SCREENING MAMMOGRAM BILATERAL BREAST       Routine preventative health maintenance measures emphasized. Please refer to After Visit Summary for other counseling recommendations.   Return in 1 year (on 02/05/2025) for Annual Wellness Visit.    Total face-to-face time with patient: 30 minutes.  Over 50% of encounter was spent on counseling and coordination of care.   Donnice CHRISTELLA Carolus, MD/MPH Attending Family Medicine Physician, [facility] for [facility], [facility]

## 2024-02-07 LAB — RPR+HBSAG+HCVAB+...
HIV Screen 4th Generation wRfx: NONREACTIVE
Hep C Virus Ab: NONREACTIVE
Hepatitis B Surface Ag: NEGATIVE
RPR Ser Ql: NONREACTIVE

## 2024-02-10 LAB — CERVICOVAGINAL ANCILLARY ONLY
Bacterial Vaginitis (gardnerella): NEGATIVE
Candida Glabrata: NEGATIVE
Candida Vaginitis: NEGATIVE
Chlamydia: NEGATIVE
Comment: NEGATIVE
Comment: NEGATIVE
Comment: NEGATIVE
Comment: NEGATIVE
Comment: NEGATIVE
Comment: NORMAL
Neisseria Gonorrhea: NEGATIVE
Trichomonas: NEGATIVE

## 2024-02-11 ENCOUNTER — Ambulatory Visit: Payer: Self-pay | Admitting: Family Medicine

## 2024-02-20 ENCOUNTER — Ambulatory Visit

## 2024-03-05 ENCOUNTER — Other Ambulatory Visit: Payer: Self-pay

## 2024-03-05 ENCOUNTER — Ambulatory Visit: Admitting: *Deleted

## 2024-03-05 VITALS — BP 142/82 | HR 63 | Ht 64.0 in | Wt 230.5 lb

## 2024-03-05 DIAGNOSIS — Z3042 Encounter for surveillance of injectable contraceptive: Secondary | ICD-10-CM | POA: Diagnosis not present

## 2024-03-05 MED ORDER — MEDROXYPROGESTERONE ACETATE 150 MG/ML IM SUSP
150.0000 mg | Freq: Once | INTRAMUSCULAR | Status: AC
  Administered 2024-03-05: 150 mg via INTRAMUSCULAR

## 2024-03-05 NOTE — Progress Notes (Signed)
 Depo provera  150 mg IM administered as scheduled. Pt tolerated well. Next dose due 12/12-12/26.  Next Annual Gyn exam is due after 02/05/25.

## 2024-05-21 ENCOUNTER — Other Ambulatory Visit: Payer: Self-pay

## 2024-05-21 ENCOUNTER — Ambulatory Visit (INDEPENDENT_AMBULATORY_CARE_PROVIDER_SITE_OTHER): Admitting: *Deleted

## 2024-05-21 VITALS — BP 158/90 | HR 66 | Ht 64.0 in | Wt 229.0 lb

## 2024-05-21 DIAGNOSIS — Z3042 Encounter for surveillance of injectable contraceptive: Secondary | ICD-10-CM

## 2024-05-21 MED ORDER — MEDROXYPROGESTERONE ACETATE 150 MG/ML IM SUSY
150.0000 mg | PREFILLED_SYRINGE | Freq: Once | INTRAMUSCULAR | Status: AC
  Administered 2024-05-21: 150 mg via INTRAMUSCULAR

## 2024-05-21 NOTE — Progress Notes (Signed)
 Nico L Diven here for Depo-Provera  Injection. Injection administered without complication. Patient will return in 3 months for next injection between 08/06/24 and 08/20/24. Next annual visit due after 02/05/25.   [facility]  05/21/25 10:35 AM

## 2024-05-28 ENCOUNTER — Ambulatory Visit (INDEPENDENT_AMBULATORY_CARE_PROVIDER_SITE_OTHER): Admitting: Nurse Practitioner

## 2024-05-28 VITALS — BP 132/86 | HR 74 | Temp 98.3°F | Ht 64.0 in | Wt 228.2 lb

## 2024-05-28 DIAGNOSIS — Z13220 Encounter for screening for lipoid disorders: Secondary | ICD-10-CM | POA: Diagnosis not present

## 2024-05-28 DIAGNOSIS — I10 Essential (primary) hypertension: Secondary | ICD-10-CM | POA: Diagnosis not present

## 2024-05-28 DIAGNOSIS — Z136 Encounter for screening for cardiovascular disorders: Secondary | ICD-10-CM

## 2024-05-28 DIAGNOSIS — E559 Vitamin D deficiency, unspecified: Secondary | ICD-10-CM

## 2024-05-28 DIAGNOSIS — E041 Nontoxic single thyroid nodule: Secondary | ICD-10-CM

## 2024-05-28 DIAGNOSIS — E66812 Obesity, class 2: Secondary | ICD-10-CM

## 2024-05-28 DIAGNOSIS — Z6836 Body mass index (BMI) 36.0-36.9, adult: Secondary | ICD-10-CM

## 2024-05-28 MED ORDER — TELMISARTAN 20 MG PO TABS: 20.0000 mg | ORAL_TABLET | Freq: Every day | ORAL | 3 refills | Status: AC

## 2024-05-28 NOTE — Assessment & Plan Note (Signed)
 Hypertension Blood pressure fluctuates with elevated readings. Telmisartan  chosen for insurance coverage and longer half-life. - Prescribed telmisartan  20mg /day. - Follow-up in six weeks for blood pressure check. - RTC for repeat metabolic panel in 1-2 weeks.

## 2024-05-28 NOTE — Assessment & Plan Note (Signed)
 Encounter for lipid screening for cardiovascular disease Annual lipid screening due. Fasting recommended for accuracy. - Ordered fasting lipid panel.

## 2024-05-28 NOTE — Assessment & Plan Note (Signed)
 Thyroid  nodule Previous ultrasound showed insignificant nodule. Thyroid  function tests were normal last year. - Ordered thyroid  panel.

## 2024-05-28 NOTE — Progress Notes (Signed)
 "  Established Patient Office Visit  Subjective   Patient ID: Tammy Scott, female    DOB: 21-Nov-1975  Age: 48 y.o. MRN: [id number]  Chief Complaint  Patient presents with   Hypertension    Discussed the use of AI scribe software for clinical note transcription with the patient, who gave verbal consent to proceed.  History of Present Illness Tammy Scott is a 48 year old female with hypertension who presents for follow-up on her chronic medical condtions  Weight gain and metabolic concerns - Progressive weight gain over the past three years - Difficulty losing weight despite prior success - Concern that weight gain may be related to thyroid  nodule or enlarged pituitary gland seen on prior imaging - Inquires about dietary changes   Hypertension - Blood pressure fluctuates over the past 1.5 years - Elevated readings up to 172/95 mmHg  - Headaches associated with elevated blood pressure  Thyroid  and pituitary gland abnormalities - History of thyroid  nodule and normal variant of pituitary gland on prior imaging - Concern about possible contribution of glandular abnormalities to weight gain and metabolic symptoms  Vitamin D  deficiency - Takes vitamin D  supplements for low levels       ROS: see HPI    Objective:     BP 132/86   Pulse 74   Temp 98.3 F (36.8 C) (Temporal)   Ht 5' 4 (1.626 m)   Wt 228 lb 4 oz (103.5 kg)   SpO2 98%   BMI 39.18 kg/m  BP Readings from Last 3 Encounters:  05/28/24 132/86  05/21/24 (!) 158/90  03/05/24 (!) 142/82   Wt Readings from Last 3 Encounters:  05/28/24 228 lb 4 oz (103.5 kg)  05/21/24 229 lb (103.9 kg)  03/05/24 230 lb 8 oz (104.6 kg)      Physical Exam Vitals reviewed.  Constitutional:      Appearance: Normal appearance.  HENT:     Head: Normocephalic and atraumatic.     Right Ear: Tympanic membrane, ear canal and external ear normal.     Left Ear: Tympanic membrane, ear canal and external ear normal.   Eyes:     General:        Right eye: No discharge.        Left eye: No discharge.     Extraocular Movements: Extraocular movements intact.     Conjunctiva/sclera: Conjunctivae normal.     Pupils: Pupils are equal, round, and reactive to light.  Cardiovascular:     Rate and Rhythm: Normal rate.     Pulses: Normal pulses.     Heart sounds: No murmur heard. Pulmonary:     Effort: Pulmonary effort is normal.  Chest:  Breasts:    Breasts are symmetrical.     Right: Normal.     Left: Normal.  Abdominal:     General: Abdomen is flat. Bowel sounds are normal. There is no distension.     Palpations: Abdomen is soft. There is no mass.     Tenderness: There is no abdominal tenderness.  Musculoskeletal:        General: No tenderness.     Cervical back: Neck supple. No muscular tenderness.     Right lower leg: No edema.     Left lower leg: No edema.  Lymphadenopathy:     Cervical: No cervical adenopathy.     Upper Body:     Right upper body: No supraclavicular adenopathy.     Left upper body: No supraclavicular adenopathy.  Skin:    General: Skin is warm and dry.  Neurological:     General: No focal deficit present.     Mental Status: She is alert and oriented to person, place, and time.     Motor: No weakness.     Gait: Gait normal.  Psychiatric:        Mood and Affect: Mood normal.        Behavior: Behavior normal.        Judgment: Judgment normal.      No results found for any visits on 05/28/24.    The 10-year ASCVD risk score (Arnett DK, et al., 2019) is: 1.3%    Assessment & Plan:   Problem List Items Addressed This Visit       Cardiovascular and Mediastinum   Hypertension - Primary   Hypertension Blood pressure fluctuates with elevated readings. Telmisartan chosen for insurance coverage and longer half-life. - Prescribed telmisartan 20mg /day. - Follow-up in six weeks for blood pressure check. - RTC for repeat metabolic panel in 1-2 weeks.      Relevant  Medications   telmisartan (MICARDIS) 20 MG tablet   Other Relevant Orders   CBC   Comprehensive metabolic panel with GFR   Hemoglobin A1c   Lipid panel   TSH   VITAMIN D  25 Hydroxy (Vit-D Deficiency, Fractures)   T3, free   T4, free     Endocrine   Thyroid  nodule   Thyroid  nodule Previous ultrasound showed insignificant nodule. Thyroid  function tests were normal last year. - Ordered thyroid  panel.        Other   Class 2 severe obesity with serious comorbidity and body mass index (BMI) of 36.0 to 36.9 in adult   Class 2 obesity Likely multifactorial. Discussed previous evaluation and previous lab results. Unlikely to be related to benign variant of pituitary.  - Ordered labs - Discussed lifestyle modifications, specifically calorie deficit diet and low-glycemic index diet      Relevant Medications   telmisartan (MICARDIS) 20 MG tablet   Other Relevant Orders   CBC   Comprehensive metabolic panel with GFR   Hemoglobin A1c   Lipid panel   TSH   VITAMIN D  25 Hydroxy (Vit-D Deficiency, Fractures)   T3, free   T4, free   Vitamin D  deficiency   Vitamin D  deficiency Currently on supplementation. Levels to be checked in annual labs. - Ordered vitamin D  level in annual labs.      Relevant Medications   telmisartan (MICARDIS) 20 MG tablet   Other Relevant Orders   CBC   Comprehensive metabolic panel with GFR   Hemoglobin A1c   Lipid panel   TSH   VITAMIN D  25 Hydroxy (Vit-D Deficiency, Fractures)   T3, free   T4, free   Encounter for lipid screening for cardiovascular disease   Encounter for lipid screening for cardiovascular disease Annual lipid screening due. Fasting recommended for accuracy. - Ordered fasting lipid panel.      Relevant Medications   telmisartan (MICARDIS) 20 MG tablet   Other Relevant Orders   CBC   Comprehensive metabolic panel with GFR   Hemoglobin A1c   Lipid panel   TSH   VITAMIN D  25 Hydroxy (Vit-D Deficiency, Fractures)   T3, free    T4, free   Assessment and Plan Assessment & Plan Hypertension Blood pressure fluctuates with elevated readings. Telmisartan chosen for insurance coverage and longer half-life. - Prescribed telmisartan 20mg /day. - Follow-up in six weeks for blood pressure  check. - RTC for repeat metabolic panel in 1-2 weeks.  Class 2 obesity Likely multifactorial. Discussed previous evaluation and previous lab results. Unlikely to be related to benign variant of pituitary.  - Ordered labs - Discussed lifestyle modifications, specifically calorie deficit diet and low-glycemic index diet  Vitamin D  deficiency Currently on supplementation. Levels to be checked in annual labs. - Ordered vitamin D  level in annual labs.  Encounter for lipid screening for cardiovascular disease Annual lipid screening due. Fasting recommended for accuracy. - Ordered fasting lipid panel.  Thyroid  nodule Previous ultrasound showed insignificant nodule. Thyroid  function tests were normal last year. - Ordered thyroid  panel.    Return in about 6 weeks (around 07/09/2024) for F/U with Lauraine.    Lauraine FORBES Pereyra, NP  "

## 2024-05-28 NOTE — Assessment & Plan Note (Signed)
 Class 2 obesity Likely multifactorial. Discussed previous evaluation and previous lab results. Unlikely to be related to benign variant of pituitary.  - Ordered labs - Discussed lifestyle modifications, specifically calorie deficit diet and low-glycemic index diet

## 2024-05-28 NOTE — Assessment & Plan Note (Signed)
 Vitamin D  deficiency Currently on supplementation. Levels to be checked in annual labs. - Ordered vitamin D  level in annual labs.

## 2024-05-28 NOTE — Patient Instructions (Addendum)
-  Eat 1100-1200 cal/day. Track with an app on smart phone, there are many free ones available -Exercise 187mins/week -Drink 60-80 ounces of water /day -Food scale: consider using this to weigh foods so you know exactly what your portion sizes are -Try to follow a low-glycemic index diet

## 2024-06-11 ENCOUNTER — Other Ambulatory Visit

## 2024-06-11 DIAGNOSIS — I10 Essential (primary) hypertension: Secondary | ICD-10-CM | POA: Diagnosis not present

## 2024-06-11 DIAGNOSIS — Z6836 Body mass index (BMI) 36.0-36.9, adult: Secondary | ICD-10-CM | POA: Diagnosis not present

## 2024-06-11 DIAGNOSIS — Z136 Encounter for screening for cardiovascular disorders: Secondary | ICD-10-CM | POA: Diagnosis not present

## 2024-06-11 DIAGNOSIS — E559 Vitamin D deficiency, unspecified: Secondary | ICD-10-CM

## 2024-06-11 DIAGNOSIS — Z13220 Encounter for screening for lipoid disorders: Secondary | ICD-10-CM

## 2024-06-11 DIAGNOSIS — E66812 Obesity, class 2: Secondary | ICD-10-CM

## 2024-06-11 LAB — CBC
HCT: 37.7 % (ref 36.0–46.0)
Hemoglobin: 12.6 g/dL (ref 12.0–15.0)
MCHC: 33.3 g/dL (ref 30.0–36.0)
MCV: 90.2 fl (ref 78.0–100.0)
Platelets: 270 K/uL (ref 150.0–400.0)
RBC: 4.18 Mil/uL (ref 3.87–5.11)
RDW: 13.3 % (ref 11.5–15.5)
WBC: 4.3 K/uL (ref 4.0–10.5)

## 2024-06-11 LAB — COMPREHENSIVE METABOLIC PANEL WITH GFR
ALT: 19 U/L (ref 3–35)
AST: 16 U/L (ref 5–37)
Albumin: 4.1 g/dL (ref 3.5–5.2)
Alkaline Phosphatase: 45 U/L (ref 39–117)
BUN: 13 mg/dL (ref 6–23)
CO2: 28 meq/L (ref 19–32)
Calcium: 9.6 mg/dL (ref 8.4–10.5)
Chloride: 105 meq/L (ref 96–112)
Creatinine, Ser: 0.74 mg/dL (ref 0.40–1.20)
GFR: 97.21 mL/min
Glucose, Bld: 90 mg/dL (ref 70–99)
Potassium: 4 meq/L (ref 3.5–5.1)
Sodium: 139 meq/L (ref 135–145)
Total Bilirubin: 0.4 mg/dL (ref 0.2–1.2)
Total Protein: 7.6 g/dL (ref 6.0–8.3)

## 2024-06-11 LAB — LIPID PANEL
Cholesterol: 165 mg/dL (ref 28–200)
HDL: 74 mg/dL
LDL Cholesterol: 77 mg/dL (ref 10–99)
NonHDL: 91.09
Total CHOL/HDL Ratio: 2
Triglycerides: 68 mg/dL (ref 10.0–149.0)
VLDL: 13.6 mg/dL (ref 0.0–40.0)

## 2024-06-11 LAB — VITAMIN D 25 HYDROXY (VIT D DEFICIENCY, FRACTURES): VITD: 26.61 ng/mL — ABNORMAL LOW (ref 30.00–100.00)

## 2024-06-11 LAB — TSH: TSH: 2.91 u[IU]/mL (ref 0.35–5.50)

## 2024-06-11 LAB — T3, FREE: T3, Free: 3.1 pg/mL (ref 2.3–4.2)

## 2024-06-11 LAB — HEMOGLOBIN A1C: Hgb A1c MFr Bld: 5.7 % (ref 4.6–6.5)

## 2024-06-11 LAB — T4, FREE: Free T4: 0.66 ng/dL (ref 0.60–1.60)

## 2024-06-12 ENCOUNTER — Ambulatory Visit: Payer: Self-pay | Admitting: Nurse Practitioner

## 2024-07-16 ENCOUNTER — Ambulatory Visit: Admitting: Nurse Practitioner

## 2024-07-19 ENCOUNTER — Ambulatory Visit: Admitting: Family Medicine

## 2024-08-06 ENCOUNTER — Ambulatory Visit: Payer: Self-pay
